# Patient Record
Sex: Male | Born: 1955 | Race: White | Hispanic: No | Marital: Married | Smoking: Never smoker
Health system: Southern US, Community
[De-identification: ages and names within clinical notes are randomized; demographics above are authoritative.]

## PROBLEM LIST (undated history)

## (undated) DIAGNOSIS — J45909 Unspecified asthma, uncomplicated: Secondary | ICD-10-CM

## (undated) DIAGNOSIS — K219 Gastro-esophageal reflux disease without esophagitis: Secondary | ICD-10-CM

## (undated) DIAGNOSIS — J309 Allergic rhinitis, unspecified: Secondary | ICD-10-CM

## (undated) DIAGNOSIS — I10 Essential (primary) hypertension: Secondary | ICD-10-CM

## (undated) HISTORY — DX: Essential (primary) hypertension: I10

## (undated) HISTORY — PX: TONSILLECTOMY: SUR1361

## (undated) HISTORY — DX: Gastro-esophageal reflux disease without esophagitis: K21.9

## (undated) HISTORY — DX: Allergic rhinitis, unspecified: J30.9

## (undated) HISTORY — DX: Unspecified asthma, uncomplicated: J45.909

## (undated) HISTORY — PX: CIRCUMCISION: SUR203

---

## 1999-12-02 ENCOUNTER — Encounter: Admission: RE | Admit: 1999-12-02 | Discharge: 1999-12-02 | Payer: Self-pay | Admitting: Family Medicine

## 1999-12-02 ENCOUNTER — Encounter: Payer: Self-pay | Admitting: Family Medicine

## 2000-09-21 ENCOUNTER — Encounter: Admission: RE | Admit: 2000-09-21 | Discharge: 2000-09-21 | Payer: Self-pay | Admitting: *Deleted

## 2000-09-21 ENCOUNTER — Encounter: Payer: Self-pay | Admitting: *Deleted

## 2004-03-19 ENCOUNTER — Ambulatory Visit: Payer: Self-pay | Admitting: Internal Medicine

## 2004-03-28 ENCOUNTER — Ambulatory Visit: Payer: Self-pay | Admitting: Internal Medicine

## 2004-04-03 ENCOUNTER — Ambulatory Visit: Payer: Self-pay | Admitting: Internal Medicine

## 2004-04-11 ENCOUNTER — Ambulatory Visit: Payer: Self-pay | Admitting: Internal Medicine

## 2004-04-25 ENCOUNTER — Ambulatory Visit: Payer: Self-pay | Admitting: Internal Medicine

## 2004-05-01 ENCOUNTER — Ambulatory Visit: Payer: Self-pay | Admitting: Internal Medicine

## 2004-05-09 ENCOUNTER — Ambulatory Visit: Payer: Self-pay | Admitting: Internal Medicine

## 2004-05-15 ENCOUNTER — Ambulatory Visit: Payer: Self-pay | Admitting: Internal Medicine

## 2004-05-29 ENCOUNTER — Ambulatory Visit: Payer: Self-pay | Admitting: Internal Medicine

## 2004-07-30 ENCOUNTER — Ambulatory Visit: Payer: Self-pay | Admitting: Internal Medicine

## 2004-08-05 ENCOUNTER — Ambulatory Visit: Payer: Self-pay | Admitting: Internal Medicine

## 2004-10-24 ENCOUNTER — Ambulatory Visit: Payer: Self-pay | Admitting: Internal Medicine

## 2004-12-23 ENCOUNTER — Ambulatory Visit: Payer: Self-pay | Admitting: Internal Medicine

## 2005-05-01 ENCOUNTER — Ambulatory Visit: Payer: Self-pay | Admitting: Internal Medicine

## 2005-06-25 ENCOUNTER — Ambulatory Visit: Payer: Self-pay | Admitting: Internal Medicine

## 2009-09-06 ENCOUNTER — Ambulatory Visit: Payer: Self-pay | Admitting: Internal Medicine

## 2009-09-06 DIAGNOSIS — I10 Essential (primary) hypertension: Secondary | ICD-10-CM | POA: Insufficient documentation

## 2009-09-06 DIAGNOSIS — R0602 Shortness of breath: Secondary | ICD-10-CM | POA: Insufficient documentation

## 2009-09-06 DIAGNOSIS — K219 Gastro-esophageal reflux disease without esophagitis: Secondary | ICD-10-CM | POA: Insufficient documentation

## 2009-09-06 DIAGNOSIS — J3089 Other allergic rhinitis: Secondary | ICD-10-CM

## 2009-09-06 DIAGNOSIS — J302 Other seasonal allergic rhinitis: Secondary | ICD-10-CM | POA: Insufficient documentation

## 2009-10-08 ENCOUNTER — Ambulatory Visit: Payer: Self-pay | Admitting: Internal Medicine

## 2009-12-09 ENCOUNTER — Ambulatory Visit: Payer: Self-pay | Admitting: Internal Medicine

## 2010-04-08 NOTE — Assessment & Plan Note (Signed)
Summary: ROV 2 MONTHS///KP   Primary Provider/Referring Provider:  C. Allan Ross/ [place]  CC:  2 month follow up visit-SOB and GERD; has changed diet and exercise-doing better.Marland Kitchen  History of Present Illness: History of Present Illness: September 06, 2009- 55 yo M previously seen in 2007 for allergic rhinitis and asthmatic bronchitis on allergy vaccine. Never smked. He had been doing much better in recent years. GERD was a recognized part of his bronchits and was being treated otc. In the last 3-4 weeks, for unclear reason, his GERD symptoms and cough have been worse. Chest tight without chest pain. Some cough, no wheeze. He restarted Prevacid this week and thinks it may be helping. Always some nasal/ sinus congestion helped by claritin D. Chest worse in humid weather. Denies exertional dyspnea and breathing doesn't wake him.  Some nasal stuffiness. Uses repirator mask to work outdoors. Did use albuterol and Advair in past and was on allergy vaccine.  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 Used Prevacid for GERD. When he tried off it he noted reflux again. Dr Tenny Craw put him on omeprazole 20 mg. Denies wheeze or cough. He hadn't noted relationship to the current air quality- yellow/ orange alerts. PFT- mild, small airway obstruction with reponse to bronchodilator. Qvar 80 sample is being used regularly. He isn't clear yet if it made a difference.  December 09, 2009- Allergic rhinitis, asthmatic brpnchitis, Dyspnea, GERD 2 month follow up visit-SOB and GERD; has changed diet and exercise-doing better. He is paying more attention to diet and reflux. Denies waking choking. He is aware of reflux but not heart burn. Discussed esophageal vs airway symptoms. Will get flu vax at work. He wishes to keep coming here for annual f/u for awhile.   Preventive Screening-Counseling & Management  Alcohol-Tobacco  Smoking Status: never  Current Medications (verified): 1)  Micardis 40 Mg Tabs (Telmisartan) .... Take 1 By Mouth Once Daily 2)  Klonopin 1 Mg Tabs (Clonazepam) .... Take 1 By Mouth At Bedtime 3)  Claritin-D 24 Hour 10-240 Mg Xr24h-Tab (Loratadine-Pseudoephedrine) .... Take 1 By Mouth Once Daily As Needed  Allergies (verified): 1)  ! Doxycycline 2)  ! Sulfa  Past History:  Past Medical History: Last updated: 09/06/2009 Allergic Rhinitis Asthmatic bronchitis G E R D Hypertension  Past Surgical History: Last updated: 09/06/2009 circumcision Tonsillectomy  Family History: Last updated: 09/06/2009 family hx of alleriges: hay fever Mother- died Pulmonary embolism Father- HTN  Social History: Last updated: 09/06/2009 Married with step children Purchasing non smoker occasional ETOH  Risk Factors: Smoking Status: never (12/09/2009)  Review of Systems      See HPI  The patient denies shortness of breath with activity, shortness of breath at rest, productive cough, non-productive cough, coughing up blood, chest pain, irregular heartbeats, acid heartburn, indigestion, loss of appetite, weight change, abdominal pain, difficulty swallowing, sore throat, tooth/dental problems, headaches, nasal congestion/difficulty breathing through nose, and sneezing.    Vital Signs:  Patient profile:   55 year old male Height:      71 inches Weight:      189 pounds BMI:     26.46 O2 Sat:      95 % on Room air Pulse rate:   69 / minute BP sitting:   120 / 82  (left arm) Cuff size:   regular  Vitals Entered By: Reynaldo Minium CMA (December 09, 2009 2:34 PM)  O2 Flow:  Room air CC: 2 month follow up visit-SOB and GERD; has changed diet and exercise-doing better.   Physical Exam  Additional Exam:  General: A/Ox3; pleasant and cooperative, NAD, wdwn SKIN: no rash, lesions NODES: no lymphadenopathy HEENT: [place]/AT, EOM- WNL, Conjuctivae- clear, PERRLA, TM-WNL, Nose- clear, Throat- clear  and wnl, Mallampati  III-IV NECK: Supple w/ fair ROM, JVD- none, normal carotid impulses w/o bruits Thyroid- normal to palpation CHEST: Clear to P&A, HEART: RRR, no m/g/r heard ABDOMEN: Soft and nl;  HYQ:MVHQ, nl pulses, no edema , no cyanosis or clubbing NEURO: Grossly intact to observation      Impression & Recommendations:  Problem # 1:  ALLERGIC RHINITIS (ICD-477.9)  Good control currently but he comments on seasonal stuffiness. I discussed the role of decongestants.   Problem # 2:  DYSPNEA (ICD-786.05)  There has been mild small airway spasm "asthma" - very mild, intermittent. He is comfortable now, and better able to differentiate asthma vs GERD. We will leave him with a rescue inhaler sample.  Problem # 3:  G E R D (ICD-530.81)  Ongoing GERD precautions are discussed. The following medications were removed from the medication list:    Omeprazole 20 Mg Cpdr (Omeprazole) .Marland Kitchen... Take 1 by mouth once daily  Other Orders: Est. Patient Level IV (46962)  Patient Instructions: 1)  cc Dr Tenny Craw 2)  Please schedule a follow-up appointment in 1 year. 3)  Pay attention to the reflux and discuss with Dr Tenny Craw. 4)  Sample rescue inhaler: 2 pufrfs, four times a day if needed for chest tightness, wheeze, asthma.   Immunization History:  Influenza Immunization History:    Influenza:  historical (12/17/2008)

## 2010-04-08 NOTE — Miscellaneous (Signed)
Summary: Orders Update pft charges  Clinical Lists Changes  Orders: Added new Service order of Carbon Monoxide diffusing w/capacity (94720) - Signed Added new Service order of Lung Volumes (94240) - Signed Added new Service order of Spirometry (Pre & Post) (94060) - Signed 

## 2010-04-08 NOTE — Assessment & Plan Note (Signed)
Summary: ROV AFTER PFT ///KP   Primary Provider/Referring Provider:  C. Allan Ross/ [place]  CC:  Follow up visit after PFT.Marland Kitchen  History of Present Illness: History of Present Illness: September 06, 2009- 55 yo M previously seen in 2007 for allergic rhinitis and asthmatic bronchitis on allergy vaccine. Never smked. He had been doing much better in recent years. GERD was a recognized part of his bronchits and was being treated otc. In the last 3-4 weeks, for unclear reason, his GERD symptoms and cough have been worse. Chest tight without chest pain. Some cough, no wheeze. He restarted Prevacid this week and thinks it may be helping. Always some nasal/ sinus congestion helped by claritin D. Chest worse in humid weather. Denies exertional dyspnea and breathing doesn't wake him.  Some nasal stuffiness. Uses repirator mask to work outdoors. Did use albuterol and Advair in past and was on allergy vaccine.  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 Used Prevacid for GERD. When he tried off it he noted reflux again. Dr Tenny Craw put him on omeprazole 20 mg. Denies wheeze or cough. He hadn't noted relationship to the current air quality- yellow/ orange alerts. PFT- mild, small airway obstruction with reponse to bronchodilator. Qvar 80 sample is being used regularly. He isn't clear yet if it made a difference.   Preventive Screening-Counseling & Management  Alcohol-Tobacco     Smoking Status: never  Current Medications (verified): 1)  Omeprazole 20 Mg Cpdr (Omeprazole) .... Take 1 By Mouth Once Daily 2)  Micardis 40 Mg Tabs (Telmisartan) .... Take 1 By Mouth Once Daily 3)  Klonopin 1 Mg Tabs (Clonazepam) .... Take 1 By Mouth At Bedtime 4)  Claritin-D 24 Hour 10-240 Mg Xr24h-Tab (Loratadine-Pseudoephedrine) .... Take 1 By Mouth Once Daily As Needed  Allergies (verified): 1)  ! Doxycycline 2)  ! Sulfa  Past  History:  Past Medical History: Last updated: 09/06/2009 Allergic Rhinitis Asthmatic bronchitis G E R D Hypertension  Past Surgical History: Last updated: 09/06/2009 circumcision Tonsillectomy  Family History: Last updated: 09/06/2009 family hx of alleriges: hay fever Mother- died Pulmonary embolism Father- HTN  Social History: Last updated: 09/06/2009 Married with step children Purchasing non smoker occasional ETOH  Risk Factors: Smoking Status: never (10/08/2009)  Review of Systems      See HPI       The patient complains of shortness of breath with activity and acid heartburn.  The patient denies shortness of breath at rest, productive cough, non-productive cough, coughing up blood, chest pain, irregular heartbeats, indigestion, loss of appetite, weight change, abdominal pain, difficulty swallowing, sore throat, tooth/dental problems, headaches, nasal congestion/difficulty breathing through nose, and sneezing.    Vital Signs:  Patient profile:   55 year old male Height:      71 inches Weight:      189 pounds BMI:     26.46 O2 Sat:      95 % on Room air Pulse rate:   81 / minute BP sitting:   118 / 76  (left arm) Cuff size:   regular  Vitals Entered By: Reynaldo Minium CMA (October 08, 2009 1:56 PM)  O2 Flow:  Room air CC: Follow up visit after PFT.   Physical Exam  Additional Exam:  General: A/Ox3; pleasant and cooperative, NAD, wdwn SKIN: no rash, lesions NODES: no lymphadenopathy HEENT: [place]/AT, EOM- WNL, Conjuctivae- clear, PERRLA, TM-WNL, Nose- clear, Throat- clear and wnl,  Mallampati  III-IV NECK: Supple w/ fair ROM, JVD- none, normal carotid impulses w/o bruits Thyroid- normal to palpation CHEST: Clear to P&A, no cough or wheeze after albuterol today for PFT HEART: RRR, no m/g/r heard ABDOMEN: Soft and nl;  EAV:WUJW, nl pulses, no edema , no cyanosis or clubbing NEURO: Grossly intact to observation      CXR  Procedure date:   09/06/2009  Findings:      DG CHEST 2 VIEW - [id number]   Clinical Data: History of dyspnea.  Shortness of breath.   CHEST - 2 VIEW   Comparison: 05/30/2002 study   Findings: The cardiac silhouette is normal size and shape. Small granuloma calcifications are stable.  No active granulomatous process is seen.  No active pulmonary infiltrates are identified. Bones appear average for age.   IMPRESSION: No acute or active cardiopulmonary process is seen.  Chronic changes are stable and are detailed above.   Read By:  Crawford Givens,  M.D.     Released By:  Call, Onalee Hua,  M.D.  _________  Impression & Recommendations:  Problem # 1:  DYSPNEA (ICD-786.05)  Reversible small airway reactivity c/w mild asthma. I will have him finish Qvar sample, combined with Singulair sample. When he runs out he can quit to observe, with refillable scripts if he decides they have been helping. We can add a rescue inhaler etc as needed.  Problem # 2:  G E R D (ICD-530.81)  He is now working with Dr Tenny Craw to control this. He has a better insight into how this makes his chest feel.  His updated medication list for this problem includes:    Omeprazole 20 Mg Cpdr (Omeprazole) .Marland Kitchen... Take 1 by mouth once daily  Medications Added to Medication List This Visit: 1)  Omeprazole 20 Mg Cpdr (Omeprazole) .... Take 1 by mouth once daily 2)  Qvar 80 Mcg/act Aers (Beclomethasone dipropionate) .... 2 puffs and rinse mouth twice daily 3)  Singulair 10 Mg Tabs (Montelukast sodium) .Marland Kitchen.. 1 daily  Other Orders: Est. Patient Level IV (95621)  Patient Instructions: 1)  Please schedule a follow-up appointment in 2 months. Call sooner if needed. 2)  scripts for Qvar 80- 3)  Sample/ script for Singulair 10 mg- 1 daily 4)  if these make a difference after you finish current supplies, then you can refill and stay on them. Prescriptions: SINGULAIR 10 MG TABS (MONTELUKAST SODIUM) 1 daily  #30 x prn   Entered and Authorized by:    Waymon Budge MD   Signed by:   Waymon Budge MD on 10/08/2009   Method used:   Print then Give to Patient   RxID:   [id number] QVAR 80 MCG/ACT AERS (BECLOMETHASONE DIPROPIONATE) 2 puffs and rinse mouth twice daily  #1 x prn   Entered and Authorized by:   Waymon Budge MD   Signed by:   Waymon Budge MD on 10/08/2009   Method used:   Print then Give to Patient   RxID:   [id number]      CXR  Procedure date:  09/06/2009  Findings:      DG CHEST 2 VIEW - [id number]   Clinical Data: History of dyspnea.  Shortness of breath.   CHEST - 2 VIEW   Comparison: 05/30/2002 study   Findings: The cardiac silhouette is normal size and shape. Small granuloma calcifications are stable.  No active granulomatous process is seen.  No active pulmonary infiltrates are identified. Bones appear average for age.  IMPRESSION: No acute or active cardiopulmonary process is seen.  Chronic changes are stable and are detailed above.   Read By:  Crawford Givens,  M.D.     Released By:  Crawford Givens,  M.D.  _________

## 2010-04-08 NOTE — Assessment & Plan Note (Signed)
Summary: breathing problem/ mbw   Primary Provider/Referring Provider:  C. Allan Ross/ [place]  CC:  Pulmonary new pt-seen by CDY in 2007; cough..  History of Present Illness: September 06, 2009- 55 yo M previously seen in 2007 for allergic rhinitis and asthmatic bronchitis on allergy vaccine. Never smked. He had been doing much better in recent years. GERD was a recognized part of his bronchits and was being treated otc. In the last 3-4 weeks, for unclear reason, his GERD symptoms and cough have been worse. Chest tight without chest pain. Some cough, no wheeze. He restarted Prevacid this week and thinks it may be helping. Always some nasal/ sinus congestion helped by claritin D. Chest worse in humid weather. Denies exertional dyspnea and breathing doesn't wake him.  Some nasal stuffiness. Uses repirator mask to work outdoors. Did use albuterol and Advair in past and was on allergy vaccine.  Preventive Screening-Counseling & Management  Alcohol-Tobacco     Smoking Status: never  Current Medications (verified): 1)  Prevacid 15 Mg Cpdr (Lansoprazole) .... Take 1 By Mouth Two Times A Day 2)  Micardis 40 Mg Tabs (Telmisartan) .... Take 1 By Mouth Once Daily 3)  Klonopin 1 Mg Tabs (Clonazepam) .... Take 1 By Mouth At Bedtime 4)  Claritin-D 24 Hour 10-240 Mg Xr24h-Tab (Loratadine-Pseudoephedrine) .... Take 1 By Mouth Once Daily As Needed  Allergies (verified): 1)  ! Doxycycline 2)  ! Sulfa  Past History:  Family History: Last updated: 09/06/2009 family hx of alleriges: hay fever Mother- died Pulmonary embolism Father- HTN  Social History: Last updated: 09/06/2009 Married with step children Purchasing non smoker occasional ETOH  Risk Factors: Smoking Status: never (09/06/2009)  Past Medical History: Allergic Rhinitis Asthmatic bronchitis G E R D Hypertension  Past Surgical History: circumcision Tonsillectomy  Family History: family hx of alleriges: hay fever Mother- died  Pulmonary embolism Father- HTN  Social History: Married with step children Purchasing non smoker occasional ETOHSmoking Status:  never  Review of Systems      See HPI       The patient complains of shortness of breath at rest, non-productive cough, and acid heartburn.  The patient denies shortness of breath with activity, productive cough, coughing up blood, chest pain, irregular heartbeats, indigestion, loss of appetite, weight change, abdominal pain, difficulty swallowing, sore throat, tooth/dental problems, headaches, nasal congestion/difficulty breathing through nose, sneezing, itching, ear ache, anxiety, depression, hand/feet swelling, joint stiffness or pain, rash, change in color of mucus, and fever.    Vital Signs:  Patient profile:   55 year old male Weight:      183 pounds O2 Sat:      94 % on Room air Pulse rate:   78 / minute BP sitting:   118 / 76  (left arm) Cuff size:   regular  Vitals Entered By: Reynaldo Minium CMA (September 06, 2009 1:37 PM)  O2 Flow:  Room air CC: Pulmonary new pt-seen by CDY in 2007; cough.   Physical Exam  Additional Exam:  General: A/Ox3; pleasant and cooperative, NAD, wdwn SKIN: no rash, lesions NODES: no lymphadenopathy HEENT: [place]/AT, EOM- WNL, Conjuctivae- clear, PERRLA, TM-WNL, Nose- clear, Throat- clear and wnl, Mallampati  III NECK: Supple w/ fair ROM, JVD- none, normal carotid impulses w/o bruits Thyroid- normal to palpation CHEST: Clear to P&A HEART: RRR, no m/g/r heard ABDOMEN: Soft and nl; nml bowel sounds; no organomegaly or masses noted VQQ:VZDG, nl pulses, no edema , no cyanosis or clubbing NEURO: Grossly intact  to observation      Impression & Recommendations:  Problem # 1:  DYSPNEA (ICD-786.05)   He realizes GERD might cause these feelings. Air quality/ pollen allergy and tracheobronchitis/ asthma may be important. We will give steroid inhaler sample, CXR, PFT.  Problem # 2:  G E R D (ICD-530.81)  Continue regular  use of acid blocker for at least another week to assess effect. His updated medication list for this problem includes:    Prevacid 15 Mg Cpdr (Lansoprazole) .Marland Kitchen... Take 1 by mouth two times a day  Problem # 3:  ALLERGIC RHINITIS (ICD-477.9) Perrenial stuffiness but not a significant exam today. Encouraged ongoing use of his mask when outdoors.  Medications Added to Medication List This Visit: 1)  Prevacid 15 Mg Cpdr (Lansoprazole) .... Take 1 by mouth two times a day 2)  Micardis 40 Mg Tabs (Telmisartan) .... Take 1 by mouth once daily 3)  Klonopin 1 Mg Tabs (Clonazepam) .... Take 1 by mouth at bedtime 4)  Claritin-d 24 Hour 10-240 Mg Xr24h-tab (Loratadine-pseudoephedrine) .... Take 1 by mouth once daily as needed  Other Orders: Est. Patient Level IV (99214) T-2 View CXR (71020TC)  Patient Instructions: 1)  Please schedule a follow-up appointment in 1 month. 2)  Use up sample Qvar 80, 2 puffs and rinse mouth, once every day 3)  Continue acid blocker for at least another week before deciding whether it makes a difference. 4)  A chest x-ray has been recommended.  Your imaging study may require preauthorization.  5)  Schedule PFT

## 2010-12-03 ENCOUNTER — Encounter: Payer: Self-pay | Admitting: Internal Medicine

## 2010-12-08 ENCOUNTER — Encounter: Payer: Self-pay | Admitting: Internal Medicine

## 2010-12-08 ENCOUNTER — Ambulatory Visit (INDEPENDENT_AMBULATORY_CARE_PROVIDER_SITE_OTHER): Payer: 59 | Admitting: Internal Medicine

## 2010-12-08 VITALS — BP 114/82 | HR 73 | Ht 71.0 in | Wt 192.2 lb

## 2010-12-08 DIAGNOSIS — J309 Allergic rhinitis, unspecified: Secondary | ICD-10-CM

## 2010-12-08 NOTE — Assessment & Plan Note (Signed)
Not quite good enough this year.  We will suggest an otc eyedrop to start, and a change from loratadine to fexofenadine for trial. Explained decongestants. He will get flu vax at work.

## 2010-12-08 NOTE — Progress Notes (Signed)
HPI- 12/08/10- 55 year old male never smoker followed for allergic rhinitis, asthmatic bronchitis, dyspnea, complicated by GERD. Last here-12/09/2009. Since last visit he is still occasionally aware of reflux and takes Prevacid. Now with a fall season he has watery eyes and nose. This year seems worse than last year. Daily loratadine is not helping very much and he plans to try Allegra. We discussed over-the-counter eyedrops for allergy. We also discussed use of decongestants. He denies chest tightness wheeze. We had given a sample of Xopenex which she has only used twice in the past year with good results. He is going to keep that all Xopenex HFA sample inhaler for a while he'll get a flu vaccine at work.  ROS- Constitutional:   No-   weight loss, night sweats, fevers, chills, fatigue, lassitude. HEENT:   No-  headaches, difficulty swallowing, tooth/dental problems, sore throat,       No-  sneezing, itching, ear ache, nasal congestion, post nasal drip,  CV:  No-   chest pain, orthopnea, PND, swelling in lower extremities, anasarca, dizziness, palpitations Resp: No-   shortness of breath with exertion or at rest.              No-   productive cough,  No non-productive cough,  No-  coughing up of blood.              No-   change in color of mucus.  Very slight rare wheezing.   Skin: No-   rash or lesions. GI:  + heartburn, indigestion, no- abdominal pain, nausea, vomiting, diarrhea,                 change in bowel habits, loss of appetite GU: No-   dysuria, change in color of urine, no urgency or frequency.  No- flank pain. MS:  No-   joint pain or swelling.  No- decreased range of motion.  No- back pain. Neuro- grossly normal to observation, Or:  Psych:  No- change in mood or affect. No depression or anxiety.  No memory loss.  OBJ General- Alert, Oriented, Affect-appropriate, Distress- none acute Skin- rash-none, lesions- none, excoriation- none Lymphadenopathy- none Head- atraumatic     Eyes- Gross vision intact, PERRLA, conjunctivae clear secretions            Ears- Hearing, canals-normal            Nose- Clear, no-Septal dev, mucus, polyps, erosion, perforation             Throat- Mallampati II , mucosa clear , drainage- none, tonsils- atrophic Neck- flexible , trachea midline, no stridor , thyroid nl, carotid no bruit Chest - symmetrical excursion , unlabored           Heart/CV- RRR , no murmur , no gallop  , no rub, nl s1 s2                           - JVD- none , edema- none, stasis changes- none, varices- none           Lung- clear to P&A, wheeze- none, cough- none , dullness-none, rub- none           Chest wall-  Abd- tender-no, distended-no, bowel sounds-present, HSM- no Br/ Gen/ Rectal- Not done, not indicated Extrem- cyanosis- none, clubbing, none, atrophy- none, strength- nl Neuro- grossly intact to observation

## 2010-12-08 NOTE — Patient Instructions (Signed)
Consider trying otc Allegra/ fexofenadine as an alternative to your loratadine as an antihistamine. You can add the decongestant when needed.  Please call as needed

## 2010-12-19 ENCOUNTER — Ambulatory Visit: Payer: Self-pay | Admitting: Internal Medicine

## 2011-05-19 ENCOUNTER — Emergency Department (HOSPITAL_COMMUNITY): Payer: BC Managed Care – PPO

## 2011-05-19 ENCOUNTER — Other Ambulatory Visit: Payer: Self-pay

## 2011-05-19 ENCOUNTER — Emergency Department (HOSPITAL_COMMUNITY)
Admission: EM | Admit: 2011-05-19 | Discharge: 2011-05-19 | Disposition: A | Discharge status: Home / Self Care | Payer: BC Managed Care – PPO | Attending: Emergency Medicine | Admitting: Emergency Medicine

## 2011-05-19 ENCOUNTER — Encounter (HOSPITAL_COMMUNITY): Payer: Self-pay | Admitting: *Deleted

## 2011-05-19 DIAGNOSIS — R0789 Other chest pain: Secondary | ICD-10-CM

## 2011-05-19 DIAGNOSIS — I10 Essential (primary) hypertension: Secondary | ICD-10-CM | POA: Insufficient documentation

## 2011-05-19 DIAGNOSIS — K219 Gastro-esophageal reflux disease without esophagitis: Secondary | ICD-10-CM

## 2011-05-19 DIAGNOSIS — R209 Unspecified disturbances of skin sensation: Secondary | ICD-10-CM | POA: Insufficient documentation

## 2011-05-19 DIAGNOSIS — R12 Heartburn: Secondary | ICD-10-CM | POA: Insufficient documentation

## 2011-05-19 LAB — CBC
Platelets: 186 10*3/uL (ref 150–400)
RBC: 4.82 MIL/uL (ref 4.22–5.81)
WBC: 5 10*3/uL (ref 4.0–10.5)

## 2011-05-19 LAB — URINALYSIS, ROUTINE W REFLEX MICROSCOPIC
Ketones, ur: NEGATIVE mg/dL
Leukocytes, UA: NEGATIVE
Nitrite: NEGATIVE
Specific Gravity, Urine: 1.01 (ref 1.005–1.030)
Urobilinogen, UA: 0.2 mg/dL (ref 0.0–1.0)
pH: 5.5 (ref 5.0–8.0)

## 2011-05-19 LAB — BASIC METABOLIC PANEL
CO2: 27 mEq/L (ref 19–32)
Calcium: 9.4 mg/dL (ref 8.4–10.5)
Chloride: 101 mEq/L (ref 96–112)
Sodium: 138 mEq/L (ref 135–145)

## 2011-05-19 LAB — TROPONIN I: Troponin I: <0.3 ng/mL (ref <0.30)

## 2011-05-19 MED ORDER — OMEPRAZOLE 20 MG PO CPDR: 20.0000 mg | DELAYED_RELEASE_CAPSULE | Freq: Every day | ORAL | Status: DC

## 2011-05-19 MED ORDER — ASPIRIN 81 MG PO CHEW
CHEWABLE_TABLET | ORAL | Status: AC
  Administered 2011-05-19: 324 mg
  Filled 2011-05-19: qty 4

## 2011-05-19 MED ORDER — ASPIRIN 325 MG PO TABS: 325.0000 mg | ORAL_TABLET | ORAL | Status: DC

## 2011-05-19 NOTE — Discharge Instructions (Signed)
[facility].Chest Pain (Nonspecific) Chest pain has many causes. Your pain could be caused by something serious, such as a heart attack or a blood clot in the lungs. It could also be caused by something less serious, such as a chest bruise or a virus. Follow up with your doctor. More lab tests or other studies may be needed to find the cause of your pain. Most of the time, nonspecific chest pain will improve within 2 to 3 days of rest and mild pain medicine. HOME CARE  For chest bruises, you may put ice on the sore area for 15 to 20 minutes, 3 to 4 times a day. Do this only if it makes you or your child feel better.   Put ice in a plastic bag.   Place a towel between the skin and the bag.   Rest for the next 2 to 3 days.   Go back to work if the pain improves.   See your doctor if the pain lasts longer than 1 to 2 weeks.   Only take medicine as told by your doctor.   Quit smoking if you smoke.  GET HELP RIGHT AWAY IF:   There is more pain or pain that spreads to the arm, neck, jaw, back, or belly (abdomen).   You or your child has shortness of breath.   You or your child coughs more than usual or coughs up blood.   You or your child has very bad back or belly pain, feels sick to his or her stomach (nauseous), or throws up (vomits).   You or your child has very bad weakness.   You or your child passes out (faints).   You or your child has a temperature by mouth above 102 F (38.9 C), not controlled by medicine.  Any of these problems may be serious and may be an emergency. Do not wait to see if the problems will go away. Get medical help right away. Call your local emergency services 911 in [place] Do not drive yourself to the hospital. MAKE SURE YOU:   Understand these instructions.   Will watch this condition.   Will get help right away if you or your child is not doing well or gets worse.  Document Released: 08/12/2007 Document Revised: 02/12/2011 Document Reviewed:  08/12/2007 [facility] Patient Information 2012 [place], [place].

## 2011-05-19 NOTE — ED Provider Notes (Signed)
History     CSN: [id number]  Arrival date & time 05/19/11  1502   First MD Initiated Contact with Patient 05/19/11 1649      Chief Complaint  Patient presents with  . Chest Pain  . Numbness    ) HPI  Patient with reported onset of chest pressure and numbness in his face and arm on the left.  Patient reports this started 2-3 days ago.  And mild.  Does not get worse when he goes swimming.  Seems to be very focal in his left upper chest.  He also complains of heartburn and reflux symptoms.  Today he had a little but of numbness to his left hand little bit of numbness in the jaw.  Denies diaphoresis, shortness of breath, nausea vomiting.  Patient has only one risk factor of hypertension and is negative for hyperlipidemia, smoking, family history.  Past Medical History  Diagnosis Date  . Allergic rhinitis, cause unspecified   . Esophageal reflux   . Unspecified essential hypertension   . Asthmatic bronchitis     Past Surgical History  Procedure Date  . Tonsillectomy   . Circumcision     Family History  Problem Relation Age of Onset  . Hypertension Father     History  Substance Use Topics  . Smoking status: Never Smoker   . Smokeless tobacco: Never Used  . Alcohol Use: Yes     Socially      Review of Systems  Allergies  Doxycycline and Sulfonamide derivatives  Home Medications   Current Outpatient Rx  Name Route Sig Dispense Refill  . HYDROXYZINE HCL 25 MG PO TABS Oral Take 25 mg by mouth at bedtime as needed. For sleep    . LANSOPRAZOLE 15 MG PO CPDR Oral Take 15 mg by mouth daily as needed. For acid reflux    . LEVALBUTEROL TARTRATE 45 MCG/ACT IN AERO Inhalation Inhale 1-2 puffs into the lungs every 4 (four) hours as needed.      Marland Kitchen LORATADINE 10 MG PO TABS Oral Take 10 mg by mouth daily.      Marland Kitchen PSEUDOEPHEDRINE HCL 30 MG PO TABS Oral Take 30 mg by mouth as needed. For sinus congestion    . TELMISARTAN 40 MG PO TABS Oral Take 40 mg by mouth daily.      Marland Kitchen  OMEPRAZOLE 20 MG PO CPDR Oral Take 1 capsule (20 mg total) by mouth daily. 30 capsule 0    BP 126/87  Pulse 64  Temp(Src) 98.2 F (36.8 C) (Oral)  Resp 20  Ht 5\' 11"  (1.803 m)  Wt 191 lb (86.637 kg)  BMI 26.64 kg/m2  SpO2 94%  Physical Exam  Nursing note and vitals reviewed. Constitutional: He is oriented to person, place, and time. He appears well-developed and well-nourished. No distress.  HENT:  Head: Normocephalic and atraumatic.  Eyes: Pupils are equal, round, and reactive to light.  Neck: Normal range of motion.  Cardiovascular: Normal rate and intact distal pulses.         Date: 05/19/2011  Rate: 73  Rhythm: normal sinus rhythm  QRS Axis: normal  Intervals: normal  ST/T Wave abnormalities: normal  Conduction Disutrbances: none  Narrative Interpretation: unremarkable      Pulmonary/Chest: No respiratory distress.  Abdominal: Normal appearance. He exhibits no distension.  Musculoskeletal: Normal range of motion.  Neurological: He is alert and oriented to person, place, and time. No cranial nerve deficit.  Skin: Skin is warm and dry. No rash noted.  Psychiatric: He has a normal mood and affect. His behavior is normal.    ED Course  Procedures (including critical care time)  Labs Reviewed  CBC - Abnormal; Notable for the following:    MCHC 36.1 (*)    All other components within normal limits  BASIC METABOLIC PANEL  TROPONIN I  URINALYSIS, ROUTINE W REFLEX MICROSCOPIC   Dg Chest 2 View  05/19/2011  *RADIOLOGY REPORT*  Clinical Data: Chest pain  CHEST - 2 VIEW  Comparison: Chest x-ray of 09/06/2009  Findings: The lungs are clear.  Mediastinal contours appear normal. The heart is within normal limits in size.  No bony abnormality is seen.  IMPRESSION: Stable chest x-ray.  No active lung disease.  Original Report Authenticated By: Juline Patch, M.D.     1. GERD (gastroesophageal reflux disease)   2. Chest discomfort       MDM          Nelia Shi, MD 05/19/11 1948

## 2011-05-19 NOTE — ED Notes (Signed)
Patient with reported onset of chest pressure and numbness in his face and arm on the left

## 2011-05-19 NOTE — ED Notes (Signed)
NAD distress at dc resp easy on labored skin warm and dry

## 2011-05-19 NOTE — ED Notes (Signed)
First contact with pt. Pt alert x3 resp easy non labored. Monitors intact wife at bedside. Pt rates cp 3/10 l chest denies radiation, sob, at this time.

## 2011-12-08 ENCOUNTER — Ambulatory Visit (INDEPENDENT_AMBULATORY_CARE_PROVIDER_SITE_OTHER): Payer: BC Managed Care – PPO | Admitting: Internal Medicine

## 2011-12-08 ENCOUNTER — Other Ambulatory Visit: Payer: BC Managed Care – PPO

## 2011-12-08 ENCOUNTER — Encounter: Payer: Self-pay | Admitting: Internal Medicine

## 2011-12-08 VITALS — BP 120/88 | HR 97 | Ht 71.0 in | Wt 188.2 lb

## 2011-12-08 DIAGNOSIS — J309 Allergic rhinitis, unspecified: Secondary | ICD-10-CM

## 2011-12-08 MED ORDER — PHENYLEPHRINE HCL 1 % NA SOLN
3.0000 [drp] | Freq: Once | NASAL | Status: AC
  Administered 2011-12-08: 3 [drp] via NASAL

## 2011-12-08 NOTE — Progress Notes (Signed)
HPI- 12/08/10- 56 year old male never smoker followed for allergic rhinitis, asthmatic bronchitis, dyspnea, complicated by GERD. Last here-12/09/2009. Since last visit he is still occasionally aware of reflux and takes Prevacid. Now with a fall season he has watery eyes and nose. This year seems worse than last year. Daily loratadine is not helping very much and he plans to try Allegra. We discussed over-the-counter eyedrops for allergy. We also discussed use of decongestants. He denies chest tightness wheeze. We had given a sample of Xopenex which he has only used twice in the past year with good results. He is going to keep that all Xopenex HFA sample inhaler for a while he'll get a flu vaccine at work.  12/08/11- 56 year old male never smoker followed for allergic rhinitis, asthmatic bronchitis, dyspnea, complicated by GERD. Recently on Cipro 500mg  x 10days for sinusitis; still having sinus pressure  and had headache yesterday; was also given Flonase by Dr Tenny Craw. Yesterday head right retro-orbital headache/frontal. That has resolved. Some postnasal drip leads to cough. Left ear feels full and makes her dizzy. Only occasionally pops.   ROS- Constitutional:   No-   weight loss, night sweats, fevers, chills, fatigue, lassitude. HEENT:   No-  headaches, difficulty swallowing, tooth/dental problems, sore throat,       No-  sneezing, itching, ear ache, nasal congestion, post nasal drip,  CV:  No-   chest pain, orthopnea, PND, swelling in lower extremities, anasarca, dizziness, palpitations Resp: No-   shortness of breath with exertion or at rest.              No-   productive cough,  No non-productive cough,  No-  coughing up of blood.              No-   change in color of mucus.  Very slight rare wheezing.   Skin: No-   rash or lesions. GI:  + heartburn, indigestion, no- abdominal pain, nausea, vomiting, GU:  MS:  No-   joint pain or swelling.  . Neuro- nothing unusual  Psych:  No- change in mood  or affect. No depression or anxiety.  No memory loss.  OBJ General- Alert, Oriented, Affect-appropriate, Distress- none acute Skin- rash-none, lesions- none, excoriation- none Lymphadenopathy- none Head- atraumatic            Eyes- Gross vision intact, PERRLA, conjunctivae clear secretions            Ears- L TM retracted without erythema or fluid.            Nose- Clear, no-Septal dev, mucus, polyps, erosion, perforation             Throat- Mallampati III , mucosa clear , drainage- none, tonsils- atrophic Neck- flexible , trachea midline, no stridor , thyroid nl, carotid no bruit Chest - symmetrical excursion , unlabored           Heart/CV- RRR , no murmur , no gallop  , no rub, nl s1 s2                           - JVD- none , edema- none, stasis changes- none, varices- none           Lung- clear to P&A, wheeze- none, cough- none , dullness-none, rub- none           Chest wall-  Abd-  Br/ Gen/ Rectal- Not done, not indicated Extrem- cyanosis- none, clubbing, none, atrophy- none, strength-  nl Neuro- grossly intact to observation

## 2011-12-08 NOTE — Patient Instructions (Addendum)
Neb neo nasal  Order- Allergy profile     Allergic rhinitis  Suggest using your Neti pot

## 2011-12-09 LAB — ALLERGY FULL PROFILE
Alternaria Alternata: <0.1 kU/L
Bahia Grass: 0.66 kU/L — ABNORMAL HIGH
Box Elder IgE: 0.47 kU/L — ABNORMAL HIGH
Cat Dander: <0.1 kU/L
Curvularia lunata: <0.1 kU/L
Elm IgE: 0.64 kU/L — ABNORMAL HIGH
Fescue: 0.56 kU/L — ABNORMAL HIGH
G009 Red Top: 0.55 kU/L — ABNORMAL HIGH
Lamb's Quarters: 0.63 kU/L — ABNORMAL HIGH
Oak: 0.56 kU/L — ABNORMAL HIGH
Sycamore Tree: 0.56 kU/L — ABNORMAL HIGH
Timothy Grass: 0.61 kU/L — ABNORMAL HIGH

## 2011-12-11 ENCOUNTER — Telehealth: Payer: Self-pay | Admitting: Internal Medicine

## 2011-12-11 NOTE — Progress Notes (Signed)
Quick Note:  LMTCB with wife ______ 

## 2011-12-11 NOTE — Telephone Encounter (Signed)
Labs to patient per CY. Patient scheduled for allergy testing January 13, 2012 at 3:30 with CY. Patient aware of protocol per Katie.

## 2011-12-17 NOTE — Assessment & Plan Note (Signed)
Recent exacerbation of rhinitis with possible sinusitis and eustachian dysfunction. Plan-allergy profile, Sudafed, saline lavage, consider limited CT scan of sinuses

## 2011-12-23 NOTE — Progress Notes (Signed)
Quick Note:  Jacob Ward, CMA 12/11/2011 4:47 PM Signed Labs to patient per CY. Patient scheduled for allergy testing January 13, 2012 at 3:30 with CY. Patient aware of protocol per Maryclaire Stoecker.   ______

## 2012-01-13 ENCOUNTER — Ambulatory Visit: Payer: BC Managed Care – PPO | Admitting: Internal Medicine

## 2012-01-13 ENCOUNTER — Ambulatory Visit (INDEPENDENT_AMBULATORY_CARE_PROVIDER_SITE_OTHER): Payer: BC Managed Care – PPO | Admitting: Internal Medicine

## 2012-01-13 ENCOUNTER — Encounter: Payer: Self-pay | Admitting: Internal Medicine

## 2012-01-13 VITALS — BP 108/70 | HR 69 | Ht 71.0 in | Wt 188.4 lb

## 2012-01-13 DIAGNOSIS — J309 Allergic rhinitis, unspecified: Secondary | ICD-10-CM

## 2012-01-13 NOTE — Progress Notes (Signed)
HPI- 12/08/10- 56 year old male never smoker followed for allergic rhinitis, asthmatic bronchitis, dyspnea, complicated by GERD. Last here-12/09/2009. Since last visit he is still occasionally aware of reflux and takes Prevacid. Now with a fall season he has watery eyes and nose. This year seems worse than last year. Daily loratadine is not helping very much and he plans to try Allegra. We discussed over-the-counter eyedrops for allergy. We also discussed use of decongestants. He denies chest tightness wheeze. We had given a sample of Xopenex which he has only used twice in the past year with good results. He is going to keep that all Xopenex HFA sample inhaler for a while he'll get a flu vaccine at work.  12/08/11- 57 year old male never smoker followed for allergic rhinitis, asthmatic bronchitis, dyspnea, complicated by GERD. Recently on Cipro 500mg  x 10days for sinusitis; still having sinus pressure  and had headache yesterday; was also given Flonase by Dr Tenny Craw. Yesterday head right retro-orbital headache/frontal. That has resolved. Some postnasal drip leads to cough. Left ear feels full and makes her dizzy. Only occasionally pops.  01/13/12-56 year old male never smoker followed for allergic rhinitis, asthmatic bronchitis, dyspnea, complicated by GERD. No antihistamines, OTC cough syrups, or OTC sleep aids in past 3 days He blew leaves wearing respirator mask. Some fullness in ears, slightly dizzy. Likes Neti pot and flonase. Decongestant only if needed. Gave own allergy vaccine years ago.  Allergy Profile 12/08/11- Total IgE 83.9, specific elevations for grass , weed and tree pollens Allergy skin testing 01/13/12- significant Pos for grass, weed, tree, dust mite, mold, cat.  ROS- Constitutional:   No-   weight loss, night sweats, fevers, chills, fatigue, lassitude. HEENT:   No-  headaches, difficulty swallowing, tooth/dental problems, sore throat,       No-  sneezing, itching, ear ache, +nasal  congestion, post nasal drip,  CV:  No-   chest pain, orthopnea, PND, swelling in lower extremities, anasarca, dizziness, palpitations Resp: No-   shortness of breath with exertion or at rest.              No-   productive cough,  No non-productive cough,  No-  coughing up of blood.              No-   change in color of mucus.  +Very slight rare wheezing.   Skin: No-   rash or lesions. GI:  + heartburn, indigestion, no- abdominal pain, nausea, vomiting, GU:  MS:  No-   joint pain or swelling.  . Neuro- nothing unusual  Psych:  No- change in mood or affect. No depression or anxiety.  No memory loss.  OBJ General- Alert, Oriented, Affect-appropriate, Distress- none acute Skin- rash-none, lesions- none, excoriation- none Lymphadenopathy- none Head- atraumatic            Eyes- Gross vision intact, PERRLA, conjunctivae clear secretions            Ears- Normal hearing, canals and TMs.            Nose- Clear, no-Septal dev, mucus, polyps, erosion, perforation             Throat- Mallampati III , mucosa clear , drainage- none, tonsils- atrophic Neck- flexible , trachea midline, no stridor , thyroid nl, carotid no bruit Chest - symmetrical excursion , unlabored           Heart/CV- RRR , no murmur , no gallop  , no rub, nl s1 s2                           -  JVD- none , edema- none, stasis changes- none, varices- none           Lung- clear to P&A, wheeze- none, cough- none , dullness-none, rub- none           Chest wall-  Abd-  Br/ Gen/ Rectal- Not done, not indicated Extrem- cyanosis- none, clubbing, none, atrophy- none, strength- nl Neuro- grossly intact to observation

## 2012-01-13 NOTE — Assessment & Plan Note (Signed)
Allergic rhinitis with eustachian dysfunction. We agreed to push the medications for best effect, then come back to the possibility of allergy vaccine later if needed.

## 2012-01-13 NOTE — Patient Instructions (Addendum)
We can start allergy shots if needed, but I would like to do the best we can with medications first.   Ok to continue neti pot, otc antihistamines and decongestants as needed  Instead of flonase for now, try sample of Dymista nasal spray     1-2 puffs each nostril once daily at bedtime

## 2012-05-04 ENCOUNTER — Telehealth: Payer: Self-pay | Admitting: Internal Medicine

## 2012-05-04 MED ORDER — FLUTICASONE PROPIONATE 50 MCG/ACT NA SUSP: 1.0000 | Freq: Every day | NASAL | Status: DC

## 2012-05-04 MED ORDER — AZELASTINE-FLUTICASONE 137-50 MCG/ACT NA SUSP: 1.0000 | Freq: Every day | NASAL | Status: DC

## 2012-05-04 MED ORDER — AZELASTINE HCL 0.1 % NA SOLN: 1.0000 | Freq: Every day | NASAL | Status: DC

## 2012-05-04 NOTE — Telephone Encounter (Signed)
rx sent, pt is aware.Jacob Ward, CMA

## 2012-05-04 NOTE — Telephone Encounter (Signed)
Pt states the dymista is too expensive and is asking for an alternative. I advised the pt that we will likely call in the 2 meds that make -up dymista. The pt states this is fine with him. Please advise. Jacob Ward, CMA Allergies  Allergen Reactions  . Doxycycline Rash  . Sulfonamide Derivatives Rash

## 2012-05-04 NOTE — Telephone Encounter (Signed)
Offer Rx Flonase/ fluticasone # 1   1-2 puffs each nostril once daily    Ref prn  And   Rx Astelin # 1, 1-2 puffs each nostril once daily    Ref prn

## 2012-05-04 NOTE — Telephone Encounter (Signed)
Spoke with pt and notified of recs per CDY He verbalized understanding and states that nothing further needed Rxs were sent to pharm

## 2012-07-08 ENCOUNTER — Telehealth: Payer: Self-pay | Admitting: Internal Medicine

## 2012-07-08 NOTE — Telephone Encounter (Signed)
MADE IN ERROR Jacob Ward  °

## 2012-07-12 ENCOUNTER — Ambulatory Visit: Payer: BC Managed Care – PPO | Admitting: Internal Medicine

## 2012-07-27 ENCOUNTER — Ambulatory Visit (INDEPENDENT_AMBULATORY_CARE_PROVIDER_SITE_OTHER): Payer: BC Managed Care – PPO | Admitting: Internal Medicine

## 2012-07-27 ENCOUNTER — Encounter: Payer: Self-pay | Admitting: Internal Medicine

## 2012-07-27 VITALS — BP 118/70 | HR 83 | Ht 71.0 in | Wt 199.0 lb

## 2012-07-27 DIAGNOSIS — J302 Other seasonal allergic rhinitis: Secondary | ICD-10-CM

## 2012-07-27 DIAGNOSIS — J309 Allergic rhinitis, unspecified: Secondary | ICD-10-CM

## 2012-07-27 DIAGNOSIS — J4521 Mild intermittent asthma with (acute) exacerbation: Secondary | ICD-10-CM

## 2012-07-27 DIAGNOSIS — J3089 Other allergic rhinitis: Secondary | ICD-10-CM

## 2012-07-27 DIAGNOSIS — J329 Chronic sinusitis, unspecified: Secondary | ICD-10-CM

## 2012-07-27 DIAGNOSIS — J45901 Unspecified asthma with (acute) exacerbation: Secondary | ICD-10-CM

## 2012-07-27 NOTE — Progress Notes (Signed)
HPI- 12/08/10- 57 year old male never smoker followed for allergic rhinitis, asthmatic bronchitis, dyspnea, complicated by GERD. Last here-12/09/2009. Since last visit he is still occasionally aware of reflux and takes Prevacid. Now with a fall season he has watery eyes and nose. This year seems worse than last year. Daily loratadine is not helping very much and he plans to try Allegra. We discussed over-the-counter eyedrops for allergy. We also discussed use of decongestants. He denies chest tightness wheeze. We had given a sample of Xopenex which he has only used twice in the past year with good results. He is going to keep that all Xopenex HFA sample inhaler for a while he'll get a flu vaccine at work.  12/08/11- 58 year old male never smoker followed for allergic rhinitis, asthmatic bronchitis, dyspnea, complicated by GERD. Recently on Cipro 500mg  x 10days for sinusitis; still having sinus pressure  and had headache yesterday; was also given Flonase by Dr Tenny Craw. Yesterday head right retro-orbital headache/frontal. That has resolved. Some postnasal drip leads to cough. Left ear feels full and makes her dizzy. Only occasionally pops.  01/13/12-57 year old male never smoker followed for allergic rhinitis, asthmatic bronchitis, dyspnea, complicated by GERD. No antihistamines, OTC cough syrups, or OTC sleep aids in past 3 days He blew leaves wearing respirator mask. Some fullness in ears, slightly dizzy. Likes Neti pot and flonase. Decongestant only if needed. Gave own allergy vaccine years ago.  Allergy Profile 12/08/11- Total IgE 83.9, specific elevations for grass , weed and tree pollens Allergy skin testing 01/13/12- significant Pos for grass, weed, tree, dust mite, mold, cat.  07/27/12- 57 year old male never smoker followed for allergic rhinitis, asthmatic bronchitis, dyspnea, complicated by GERD. FOLLOWS FOR: continues to have trouble with ears and alleriges overall. Occasional watery eyes.  Feels  pressure in the ears, frontal headache, mild unsteadiness or dizziness but not real vertigo. Using Aztepro and Flonase nasal sprays, occasional Xopenex rescue inhaler CXR 05/19/11 IMPRESSION:  Stable chest x-ray. No active lung disease.  Original Report Authenticated By: Juline Patch, M.D.   ROS- Constitutional:   No-   weight loss, night sweats, fevers, chills, fatigue, lassitude. HEENT:   +  headaches, difficulty swallowing, tooth/dental problems, sore throat,       No-  sneezing, itching, +ear ache, +nasal congestion, post nasal drip,  CV:  No-   chest pain, orthopnea, PND, swelling in lower extremities, anasarca, dizziness, palpitations Resp: No-   shortness of breath with exertion or at rest.              No-   productive cough,  No non-productive cough,  No-  coughing up of blood.              No-   change in color of mucus.  +Very slight rare wheezing.   Skin: No-   rash or lesions. GI:  + heartburn, indigestion, no- abdominal pain, nausea, vomiting, GU:  MS:  No-   joint pain or swelling.  . Neuro- nothing unusual  Psych:  No- change in mood or affect. No depression or anxiety.  No memory loss.  OBJ General- Alert, Oriented, Affect-appropriate, Distress- none acute Skin- rash-none, lesions- none, excoriation- none Lymphadenopathy- none Head- atraumatic            Eyes- Gross vision intact, PERRLA, conjunctivae clear secretions            Ears- Normal hearing, canals and TMs. No nystagmus            Nose- Clear, no-Septal  dev, mucus, polyps, erosion, perforation             Throat- Mallampati III , mucosa clear , drainage- none, tonsils- atrophic Neck- flexible , trachea midline, no stridor , thyroid nl, carotid no bruit Chest - symmetrical excursion , unlabored           Heart/CV- RRR , no murmur , no gallop  , no rub, nl s1 s2                           - JVD- none , edema- none, stasis changes- none, varices- none           Lung- clear to P&A, wheeze- none, cough- none ,  dullness-none, rub- none           Chest wall-  Abd-  Br/ Gen/ Rectal- Not done, not indicated Extrem- cyanosis- none, clubbing, none, atrophy- none, strength- nl Neuro- grossly intact to observation

## 2012-07-27 NOTE — Patient Instructions (Addendum)
Consider continuing present meds through the first week or so of June, till Spring pollens fade. Then try stopping the nasal sprays for a week or so to see how you feel. After that, if symptomatic, try otc nasal spray Nasalcrom/ cromolyn (may need to ask pharmacist to find it)  Order- limited CT max fac   Dx chronic sinusitis

## 2012-08-02 ENCOUNTER — Ambulatory Visit (INDEPENDENT_AMBULATORY_CARE_PROVIDER_SITE_OTHER)
Admission: RE | Admit: 2012-08-02 | Discharge: 2012-08-02 | Disposition: A | Discharge status: Home / Self Care | Payer: BC Managed Care – PPO | Source: Ambulatory Visit | Attending: Internal Medicine | Admitting: Internal Medicine

## 2012-08-02 ENCOUNTER — Telehealth: Payer: Self-pay | Admitting: Internal Medicine

## 2012-08-02 DIAGNOSIS — J329 Chronic sinusitis, unspecified: Secondary | ICD-10-CM

## 2012-08-02 NOTE — Telephone Encounter (Signed)
Spoke with patient, patient requesting results of CT scan done today. I have advised patient that scan has not had a result note attached yet and that I would let Dr. Maple Hudson be aware that he is wanting results. Dr. Maple Hudson please advise, thank you!  Also once result note has been done patient requesting copy of CT report be mailed to patients home

## 2012-08-03 ENCOUNTER — Telehealth: Payer: Self-pay | Admitting: Internal Medicine

## 2012-08-03 NOTE — Telephone Encounter (Signed)
Result Note    CT maxfac- minimal thickening indicating just a little recent sinus inflammation. This may be more like scarring and doesn't look like significant acute infection.         Pt aware of results.

## 2012-08-03 NOTE — Telephone Encounter (Signed)
Notes Recorded by Waymon Budge, MD on 08/03/2012 at 7:58 AM CT maxfac- minimal thickening indicating just a little recent sinus inflammation. This may be more like scarring and doesn't look like significant acute infection  Spoke with pt and notified of results per Dr. Maple Hudson. Pt verbalized understanding and denied any questions.

## 2012-08-03 NOTE — Progress Notes (Signed)
Quick Note:  Pt aware of results. ______ 

## 2012-08-03 NOTE — Telephone Encounter (Signed)
I did comment on this CT. Ok to report to him and send him a copy of the CT report as requested.

## 2012-08-07 DIAGNOSIS — J4520 Mild intermittent asthma, uncomplicated: Secondary | ICD-10-CM | POA: Insufficient documentation

## 2012-08-07 NOTE — Assessment & Plan Note (Signed)
Very minimal occasional asthma easily controlled with rare use of a rescue inhaler

## 2012-08-07 NOTE — Assessment & Plan Note (Addendum)
Rhinitis exacerbation with eustachian dysfunction Plan-medication options reviewed. Consider Nasalcrom. Limited CT scan to exclude chronic sinusitis

## 2012-09-26 ENCOUNTER — Encounter: Payer: Self-pay | Admitting: Internal Medicine

## 2012-09-26 ENCOUNTER — Ambulatory Visit (INDEPENDENT_AMBULATORY_CARE_PROVIDER_SITE_OTHER): Payer: BC Managed Care – PPO | Admitting: Internal Medicine

## 2012-09-26 VITALS — BP 124/80 | HR 78 | Ht 71.0 in | Wt 200.0 lb

## 2012-09-26 DIAGNOSIS — J329 Chronic sinusitis, unspecified: Secondary | ICD-10-CM

## 2012-09-26 DIAGNOSIS — J302 Other seasonal allergic rhinitis: Secondary | ICD-10-CM

## 2012-09-26 DIAGNOSIS — J45909 Unspecified asthma, uncomplicated: Secondary | ICD-10-CM

## 2012-09-26 DIAGNOSIS — J4521 Mild intermittent asthma with (acute) exacerbation: Secondary | ICD-10-CM

## 2012-09-26 MED ORDER — PHENYLEPHRINE HCL 1 % NA SOLN
3.0000 [drp] | Freq: Once | NASAL | Status: AC
  Administered 2012-09-26: 3 [drp] via NASAL

## 2012-09-26 MED ORDER — METHYLPREDNISOLONE ACETATE 80 MG/ML IJ SUSP
80.0000 mg | Freq: Once | INTRAMUSCULAR | Status: AC
  Administered 2012-09-26: 80 mg via INTRAMUSCULAR

## 2012-10-11 ENCOUNTER — Encounter: Payer: Self-pay | Admitting: Internal Medicine

## 2012-10-11 NOTE — Assessment & Plan Note (Signed)
Mild asthma with bronchitis Plan-resume Advair and Xopenex, using what he has left, to see if it helps the tight chest sensation.

## 2012-10-11 NOTE — Assessment & Plan Note (Signed)
More rhinitis and sinusitis. Plan- neb nasal decongestant, depo

## 2012-10-11 NOTE — Patient Instructions (Signed)
Try again with the Advair and Xopenex you have left, to see if it helps the chest tightness

## 2012-10-11 NOTE — Progress Notes (Signed)
HPI- 12/08/10- 57 year old male never smoker followed for allergic rhinitis, asthmatic bronchitis, dyspnea, complicated by GERD. Last here-12/09/2009. Since last visit he is still occasionally aware of reflux and takes Prevacid. Now with a fall season he has watery eyes and nose. This year seems worse than last year. Daily loratadine is not helping very much and he plans to try Allegra. We discussed over-the-counter eyedrops for allergy. We also discussed use of decongestants. He denies chest tightness wheeze. We had given a sample of Xopenex which he has only used twice in the past year with good results. He is going to keep that all Xopenex HFA sample inhaler for a while he'll get a flu vaccine at work.  12/08/11- 57 year old male never smoker followed for allergic rhinitis, asthmatic bronchitis, dyspnea, complicated by GERD. Recently on Cipro 500mg  x 10days for sinusitis; still having sinus pressure  and had headache yesterday; was also given Flonase by Dr Tenny Craw. Yesterday head right retro-orbital headache/frontal. That has resolved. Some postnasal drip leads to cough. Left ear feels full and makes her dizzy. Only occasionally pops.  01/13/12-57 year old male never smoker followed for allergic rhinitis, asthmatic bronchitis, dyspnea, complicated by GERD. No antihistamines, OTC cough syrups, or OTC sleep aids in past 3 days He blew leaves wearing respirator mask. Some fullness in ears, slightly dizzy. Likes Neti pot and flonase. Decongestant only if needed. Gave own allergy vaccine years ago.  Allergy Profile 12/08/11- Total IgE 83.9, specific elevations for grass , weed and tree pollens Allergy skin testing 01/13/12- significant Pos for grass, weed, tree, dust mite, mold, cat.  07/27/12- 57 year old male never smoker followed for allergic rhinitis, asthmatic bronchitis, dyspnea, complicated by GERD. FOLLOWS FOR: continues to have trouble with ears and alleriges overall. Occasional watery eyes.  Feels  pressure in the ears, frontal headache, mild unsteadiness or dizziness but not real vertigo. Using Aztepro and Flonase nasal sprays, occasional Xopenex rescue inhaler CXR 05/19/11 IMPRESSION:  Stable chest x-ray. No active lung disease.  Original Report Authenticated By: Juline Patch, M.D.  09/26/12- 57 year old male never smoker followed for allergic rhinitis, asthmatic bronchitis, dyspnea, complicated by GERD. FOLLOWS FOR: pt reports still having slight headache, chest tightness and dry cough x 2 months-- using xopenex and advair PRN and feels this helps w some sx relief Request copy of CT report CT maxfac 08/03/12 IMPRESSION:  Minimal mucosal thickening in the floors of the maxillary sinuses.  Otherwise clear sinuses.  Original Report Authenticated By: Charlett Nose, M.D.  ROS- Constitutional:   No-   weight loss, night sweats, fevers, chills, fatigue, lassitude. HEENT:   +  headaches, difficulty swallowing, tooth/dental problems, sore throat,       No-  sneezing, itching, +ear ache, +nasal congestion, post nasal drip,  CV:  No-   chest pain, orthopnea, PND, swelling in lower extremities, anasarca, dizziness, palpitations Resp: No-   shortness of breath with exertion or at rest.              No-   productive cough,  No non-productive cough,  No-  coughing up of blood.              No-   change in color of mucus.  +Very slight rare wheezing.   Skin: No-   rash or lesions. GI:  + heartburn, indigestion, no- abdominal pain, nausea, vomiting, GU:  MS:  No-   joint pain or swelling.  . Neuro- nothing unusual  Psych:  No- change in mood or affect. No depression or  anxiety.  No memory loss.  OBJ General- Alert, Oriented, Affect-appropriate, Distress- none acute Skin- rash-none, lesions- none, excoriation- none Lymphadenopathy- none Head- atraumatic            Eyes- Gross vision intact, PERRLA, conjunctivae clear secretions            Ears- + mild retraction TMss            Nose- Clear,  no-Septal dev, mucus, polyps, erosion, perforation             Throat- Mallampati III , mucosa clear , drainage- none, tonsils- atrophic Neck- flexible , trachea midline, no stridor , thyroid nl, carotid no bruit Chest - symmetrical excursion , unlabored           Heart/CV- RRR , no murmur , no gallop  , no rub, nl s1 s2                           - JVD- none , edema- none, stasis changes- none, varices- none           Lung- clear to P&A, wheeze- none, cough- none , dullness-none, rub- none           Chest wall-  Abd-  Br/ Gen/ Rectal- Not done, not indicated Extrem- cyanosis- none, clubbing, none, atrophy- none, strength- nl Neuro- grossly intact to observation

## 2012-11-04 ENCOUNTER — Telehealth: Payer: Self-pay | Admitting: Internal Medicine

## 2012-11-04 MED ORDER — FLUTICASONE-SALMETEROL 100-50 MCG/DOSE IN AEPB: 1.0000 | INHALATION_SPRAY | Freq: Two times a day (BID) | RESPIRATORY_TRACT | Status: DC

## 2012-11-04 NOTE — Telephone Encounter (Signed)
I spoke with pt and he stated to send the advair to [facility] [address]. I have done so. Pt is also active on [employer] and I advised pt his CT results are in this. He needed nothing further

## 2012-12-07 ENCOUNTER — Encounter: Payer: Self-pay | Admitting: Internal Medicine

## 2012-12-07 ENCOUNTER — Ambulatory Visit (INDEPENDENT_AMBULATORY_CARE_PROVIDER_SITE_OTHER): Payer: BC Managed Care – PPO | Admitting: Internal Medicine

## 2012-12-07 VITALS — BP 130/82 | HR 79 | Ht 71.0 in | Wt 192.8 lb

## 2012-12-07 DIAGNOSIS — G44229 Chronic tension-type headache, not intractable: Secondary | ICD-10-CM

## 2012-12-07 DIAGNOSIS — J309 Allergic rhinitis, unspecified: Secondary | ICD-10-CM

## 2012-12-07 DIAGNOSIS — J302 Other seasonal allergic rhinitis: Secondary | ICD-10-CM

## 2012-12-07 DIAGNOSIS — J4520 Mild intermittent asthma, uncomplicated: Secondary | ICD-10-CM

## 2012-12-07 DIAGNOSIS — K219 Gastro-esophageal reflux disease without esophagitis: Secondary | ICD-10-CM

## 2012-12-07 DIAGNOSIS — J45909 Unspecified asthma, uncomplicated: Secondary | ICD-10-CM

## 2012-12-07 NOTE — Progress Notes (Signed)
HPI- 12/08/10- 57 year old male never smoker followed for allergic rhinitis, asthmatic bronchitis, dyspnea, complicated by GERD. Last here-12/09/2009. Since last visit he is still occasionally aware of reflux and takes Prevacid. Now with a fall season he has watery eyes and nose. This year seems worse than last year. Daily loratadine is not helping very much and he plans to try Allegra. We discussed over-the-counter eyedrops for allergy. We also discussed use of decongestants. He denies chest tightness wheeze. We had given a sample of Xopenex which he has only used twice in the past year with good results. He is going to keep that all Xopenex HFA sample inhaler for a while he'll get a flu vaccine at work.  12/08/11- 57 year old male never smoker followed for allergic rhinitis, asthmatic bronchitis, dyspnea, complicated by GERD. Recently on Cipro 500mg  x 10days for sinusitis; still having sinus pressure  and had headache yesterday; was also given Flonase by Dr Tenny Craw. Yesterday head right retro-orbital headache/frontal. That has resolved. Some postnasal drip leads to cough. Left ear feels full and makes her dizzy. Only occasionally pops.  01/13/12-57 year old male never smoker followed for allergic rhinitis, asthmatic bronchitis, dyspnea, complicated by GERD. No antihistamines, OTC cough syrups, or OTC sleep aids in past 3 days He blew leaves wearing respirator mask. Some fullness in ears, slightly dizzy. Likes Neti pot and flonase. Decongestant only if needed. Gave own allergy vaccine years ago.  Allergy Profile 12/08/11- Total IgE 83.9, specific elevations for grass , weed and tree pollens Allergy skin testing 01/13/12- significant Pos for grass, weed, tree, dust mite, mold, cat.  07/27/12- 57 year old male never smoker followed for allergic rhinitis, asthmatic bronchitis, dyspnea, complicated by GERD. FOLLOWS FOR: continues to have trouble with ears and alleriges overall. Occasional watery eyes.  Feels  pressure in the ears, frontal headache, mild unsteadiness or dizziness but not real vertigo. Using Aztepro and Flonase nasal sprays, occasional Xopenex rescue inhaler CXR 05/19/11 IMPRESSION:  Stable chest x-ray. No active lung disease.  Original Report Authenticated By: Juline Patch, M.D.  09/26/12- 57 year old male never smoker followed for allergic rhinitis, asthmatic bronchitis, dyspnea, complicated by GERD. FOLLOWS FOR: pt reports still having slight headache, chest tightness and dry cough x 2 months-- using xopenex and advair PRN and feels this helps w some sx relief Request copy of CT report CT maxfac 08/03/12 IMPRESSION:  Minimal mucosal thickening in the floors of the maxillary sinuses.  Otherwise clear sinuses.  Original Report Authenticated By: Charlett Nose, M.D.  12/07/12- 57 year old male never smoker followed for allergic rhinitis, asthmatic bronchitis, dyspnea, complicated by GERD. FOLLOWS FOR:  Increased usage of inhalers x1 month now.  At times having SOB with exertion Get flu vaccine at work. Occasional headache. Was having more heartburn so he increased his Prilosec. Has lost weight dieting. Using Sudafed, Flonase and Astelin. Feels he needs Xopenex for less stimulation.  ROS- Constitutional:   No-   weight loss, night sweats, fevers, chills, fatigue, lassitude. HEENT:   +  headaches, difficulty swallowing, tooth/dental problems, sore throat,       No-  sneezing, itching, +ear ache, +nasal congestion, post nasal drip,  CV:  No-   chest pain, orthopnea, PND, swelling in lower extremities, anasarca, dizziness, palpitations Resp: No-   shortness of breath with exertion or at rest.              No-   productive cough,  No non-productive cough,  No-  coughing up of blood.  No-   change in color of mucus.  +Very slight rare wheezing.   Skin: No-   rash or lesions. GI:  + heartburn, indigestion, no- abdominal pain, nausea, vomiting, GU:  MS:  No-   joint pain or  swelling.  . Neuro- nothing unusual  Psych:  No- change in mood or affect. No depression or anxiety.  No memory loss.  OBJ General- Alert, Oriented, Affect-appropriate, Distress- none acute, trim Skin- rash-none, lesions- none, excoriation- none Lymphadenopathy- none Head- atraumatic            Eyes- Gross vision intact, PERRLA, conjunctivae clear secretions            Ears- + mild retraction TMss            Nose- Clear, no-Septal dev, mucus, polyps, erosion, perforation             Throat- Mallampati III , mucosa clear , drainage- none, tonsils- atrophic Neck- flexible , trachea midline, no stridor , thyroid nl, carotid no bruit Chest - symmetrical excursion , unlabored           Heart/CV- RRR , no murmur , no gallop  , no rub, nl s1 s2                           - JVD- none , edema- none, stasis changes- none, varices- none           Lung- clear to P&A, wheeze- none, cough- none , dullness-none, rub- none           Chest wall-  Abd-  Br/ Gen/ Rectal- Not done, not indicated Extrem- cyanosis- none, clubbing, none, atrophy- none, strength- nl Neuro- grossly intact to observation

## 2012-12-07 NOTE — Patient Instructions (Addendum)
Try otc otc Pepcid/ famotadine daily or twice daily before meals as an acid blocker instead of Prilosec for awhile, to compare.   We can continue your routine meds.   Please call as needed

## 2012-12-18 DIAGNOSIS — G44229 Chronic tension-type headache, not intractable: Secondary | ICD-10-CM | POA: Insufficient documentation

## 2012-12-18 NOTE — Assessment & Plan Note (Signed)
Adequate control with occasional Sudafed. Insurance preferred [employer] plus Astelin instead of Dymista

## 2012-12-18 NOTE — Assessment & Plan Note (Signed)
Mild persistent asthma controlled with Advair, rarely needing Xopenex HFA

## 2012-12-18 NOTE — Assessment & Plan Note (Signed)
I don't think headaches are due to anything in the nasopharynx, as described

## 2012-12-18 NOTE — Assessment & Plan Note (Signed)
Needing full dose acid blocker. I suggested he try changing to Pepcid for trial

## 2013-04-02 IMAGING — CR DG CHEST 2V
2 series · 2 of 2 positions shown · non-contrast
Comparison: Chest x-ray of 09/06/2009

CLINICAL DATA: Chest pain

CHEST - 2 VIEW

[view not recorded (1 of 2)]
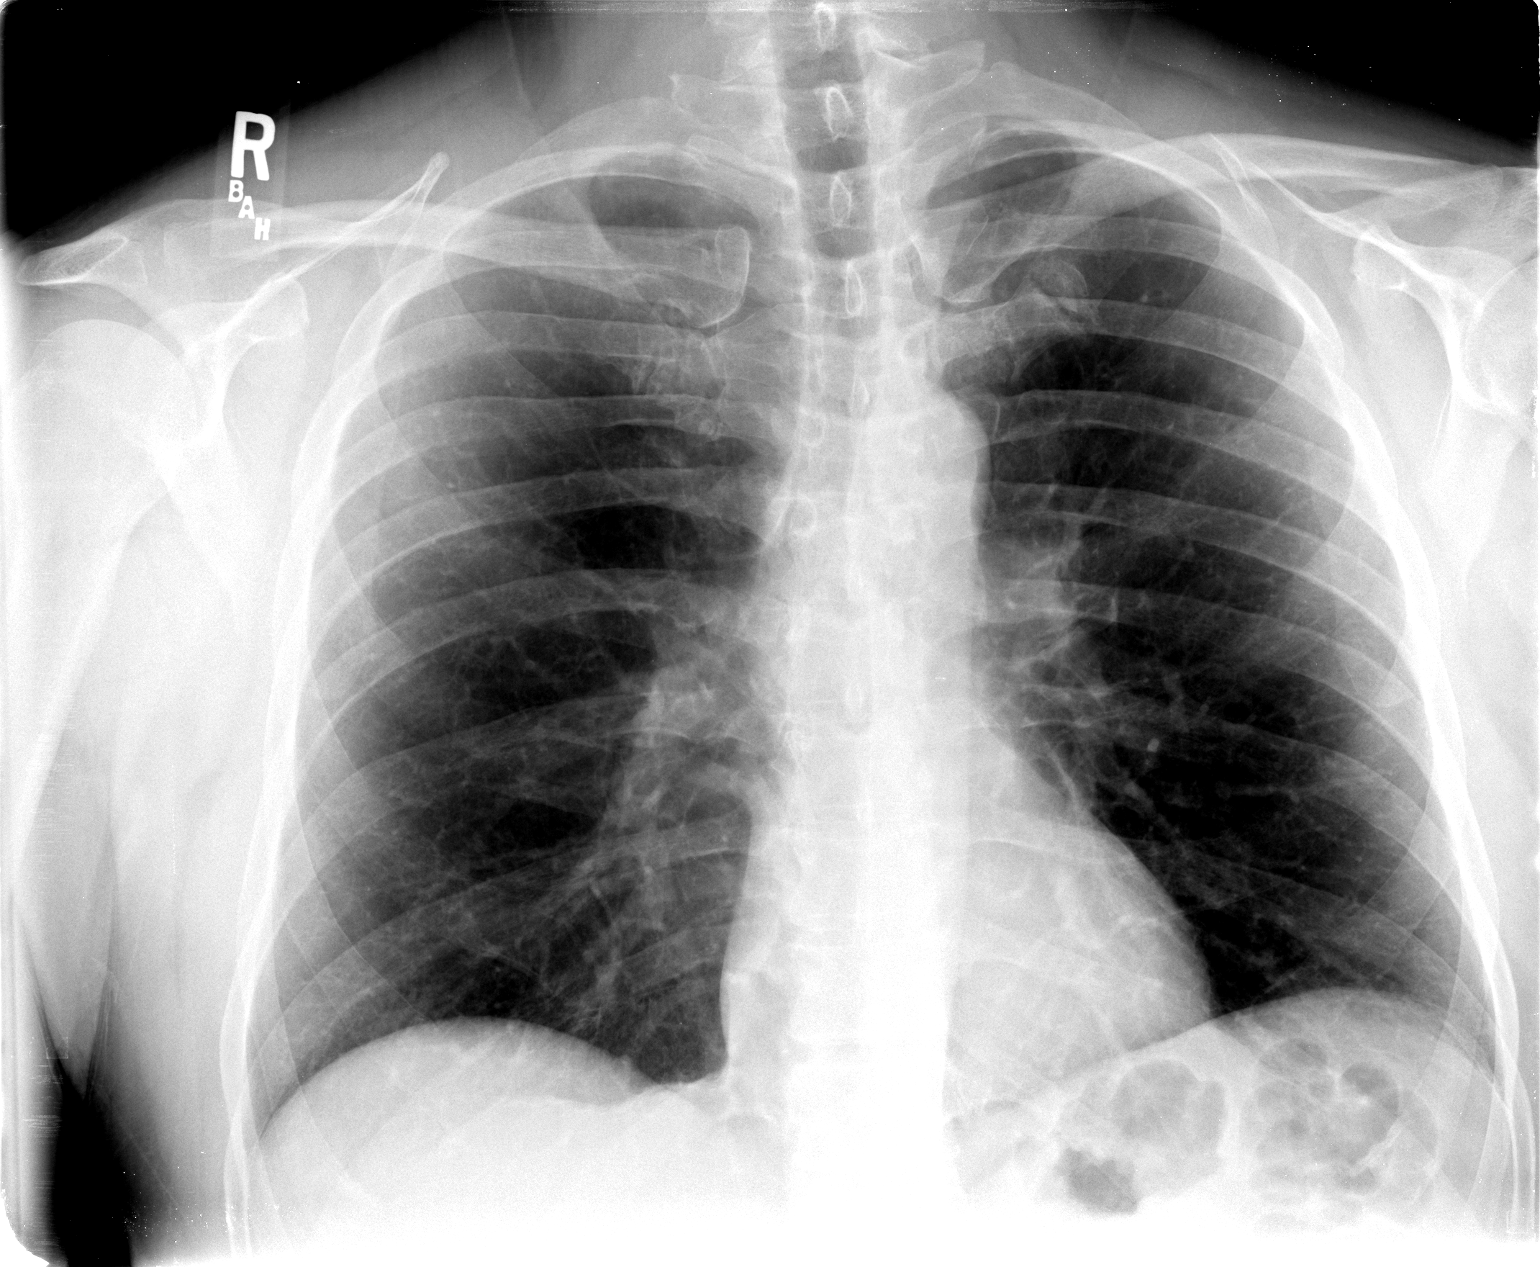

[view not recorded (2 of 2)]
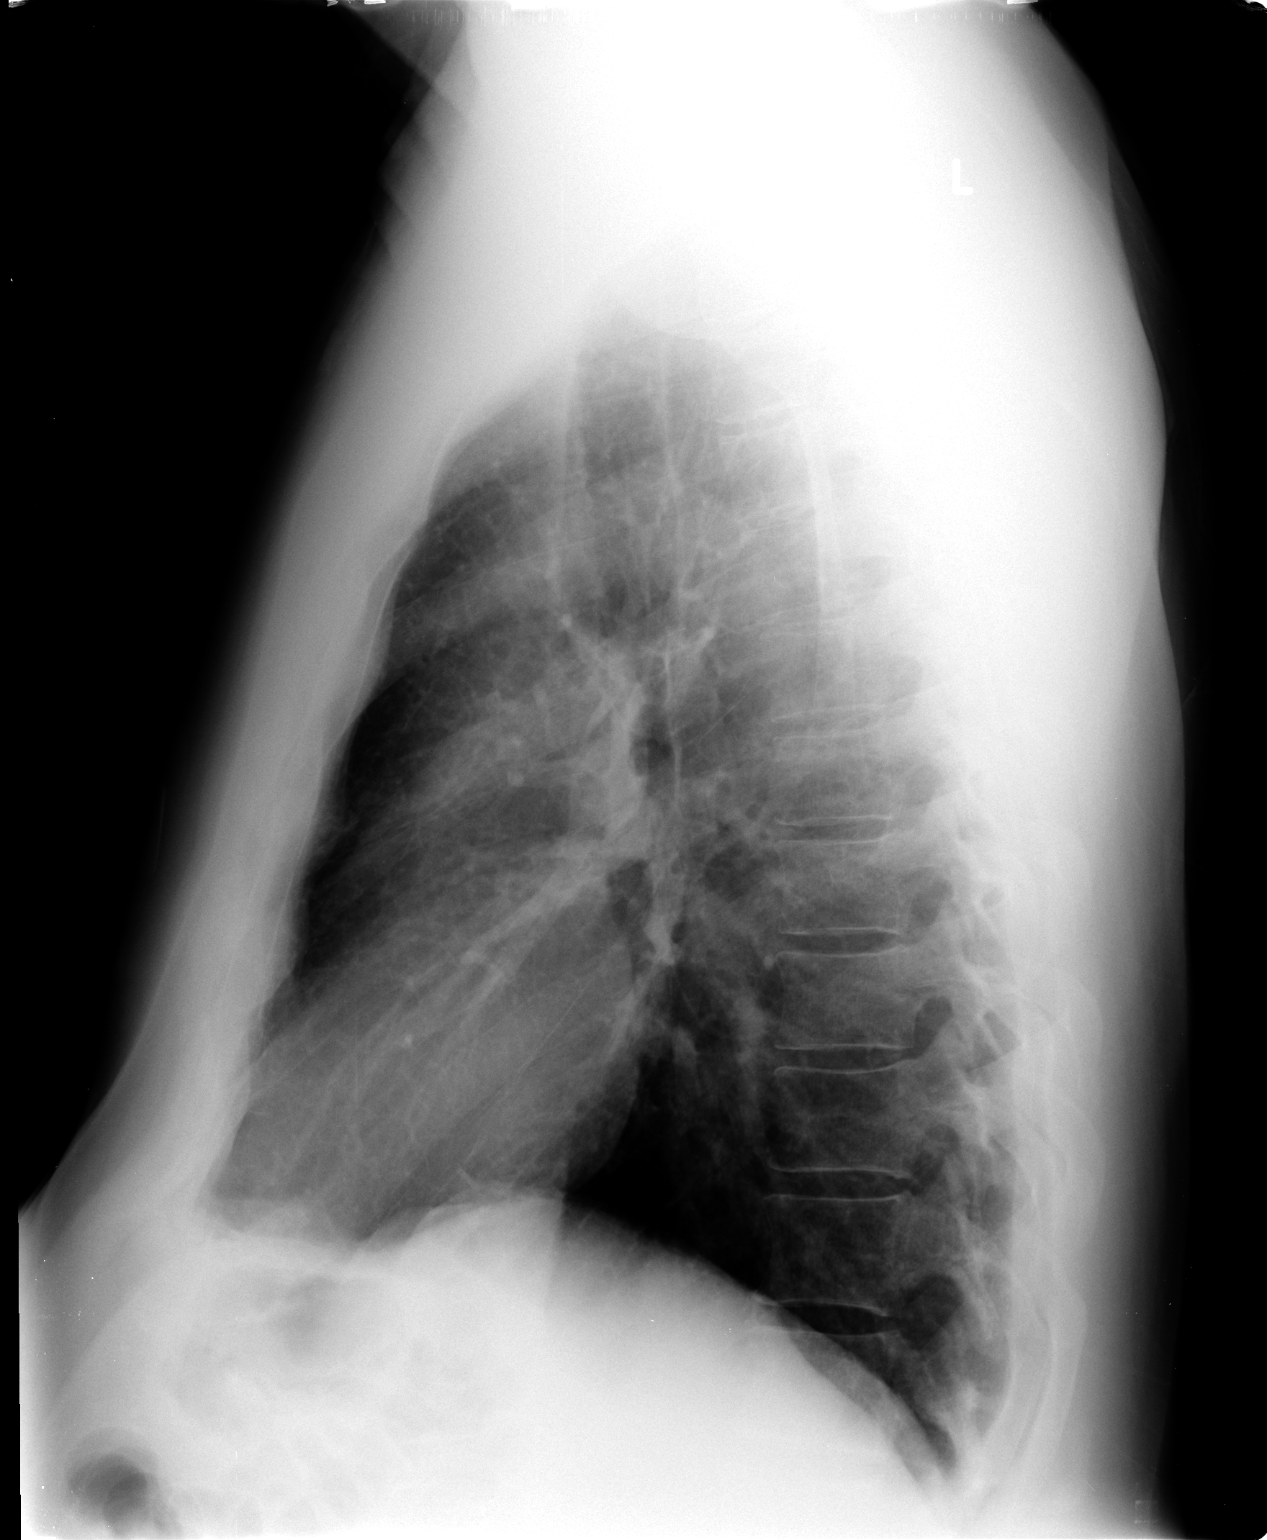

[2 of 2 positions shown; findings below may reference images not displayed]

FINDINGS: The lungs are clear.  Mediastinal contours appear normal.
The heart is within normal limits in size.  No bony abnormality is
seen.
IMPRESSION: Stable chest x-ray.  No active lung disease.

## 2013-06-07 ENCOUNTER — Ambulatory Visit: Payer: BC Managed Care – PPO | Admitting: Internal Medicine

## 2014-10-12 ENCOUNTER — Other Ambulatory Visit: Payer: Self-pay | Admitting: Gastroenterology

## 2014-10-12 DIAGNOSIS — R1084 Generalized abdominal pain: Secondary | ICD-10-CM

## 2014-10-16 ENCOUNTER — Ambulatory Visit
Admission: RE | Admit: 2014-10-16 | Discharge: 2014-10-16 | Disposition: A | Discharge status: Home / Self Care | Payer: Commercial Managed Care - PPO | Source: Ambulatory Visit | Attending: Gastroenterology | Admitting: Gastroenterology

## 2014-10-16 DIAGNOSIS — R1084 Generalized abdominal pain: Secondary | ICD-10-CM

## 2014-10-16 MED ORDER — IOPAMIDOL (ISOVUE-300) INJECTION 61%
100.0000 mL | Freq: Once | INTRAVENOUS | Status: AC | PRN
  Administered 2014-10-16: 100 mL via INTRAVENOUS

## 2016-08-30 IMAGING — CT CT ABD-PELV W/ CM
3 of 5 series · 12 of 36 positions shown, 18 images · IV contrast (READICAT/WATER & [ID] ISOVUE 300)
Comparison: None.

CLINICAL DATA: 59-year-old male with generalized abdominal pain.

EXAM:
CT ABDOMEN AND PELVIS WITH CONTRAST
TECHNIQUE: Multidetector CT imaging of the abdomen and pelvis was performed
using the standard protocol following bolus administration of
intravenous contrast.
CONTRAST:  100mL [id number] IOPAMIDOL ([id number]) INJECTION 61%

[Series 3: abd/pelvis with · axial · 0.78mm/px · z∈[-364,-34]mm · 7 of 89 slices shown, 12 images]
[im 12/89  soft-tissue]
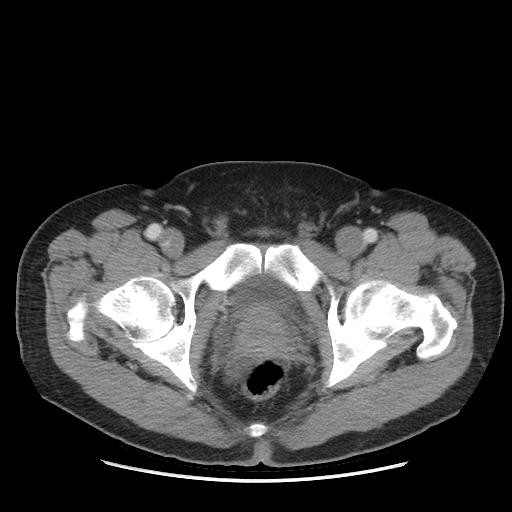
[im 12/89  bone]
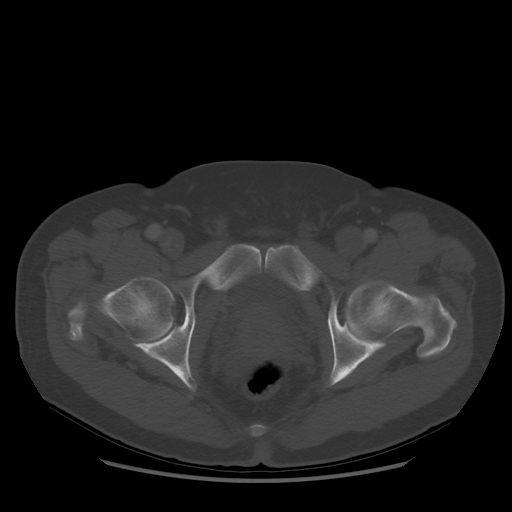
[im 23/89  soft-tissue]
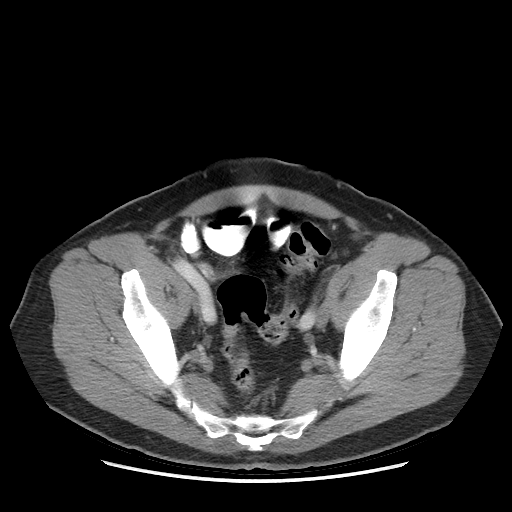
[im 34/89  soft-tissue]
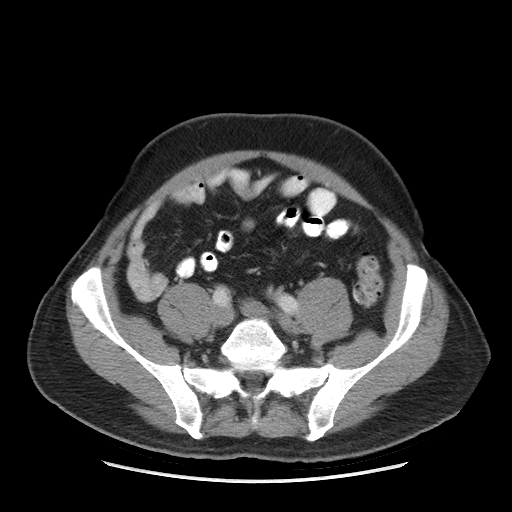
[im 45/89  soft-tissue]
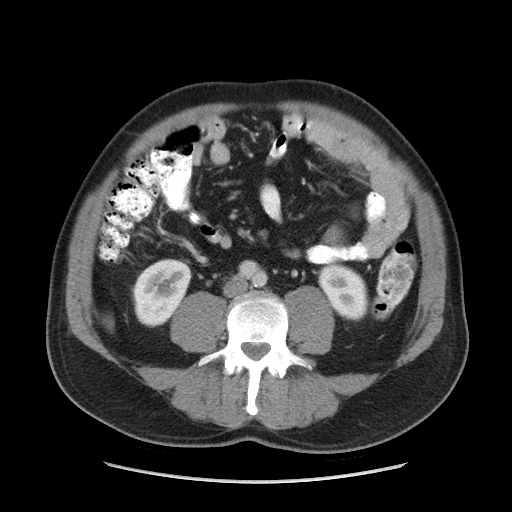
[im 45/89  lung]
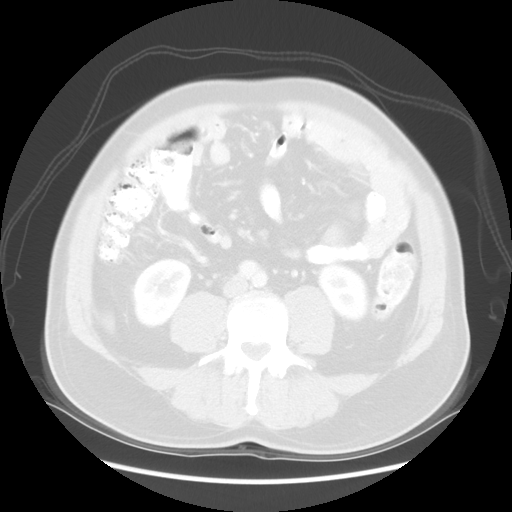
[im 56/89  soft-tissue]
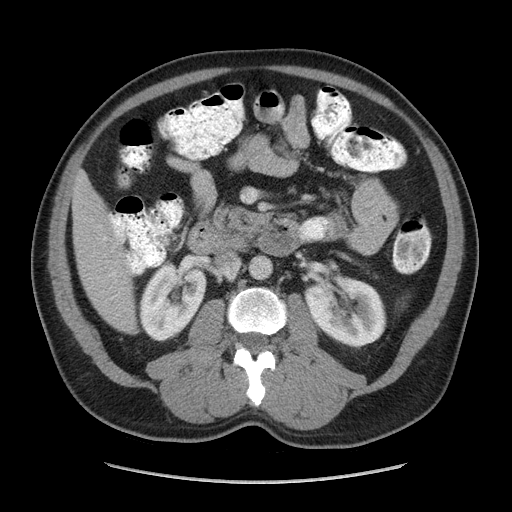
[im 56/89  lung]
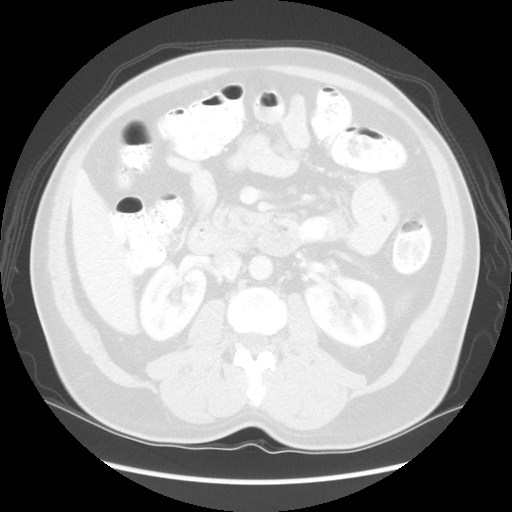
[im 67/89  soft-tissue]
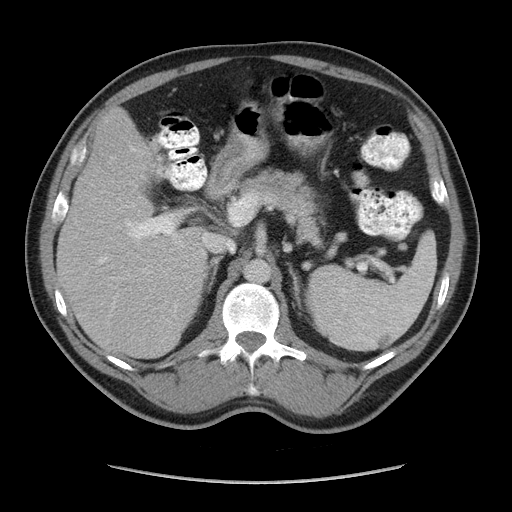
[im 67/89  lung]
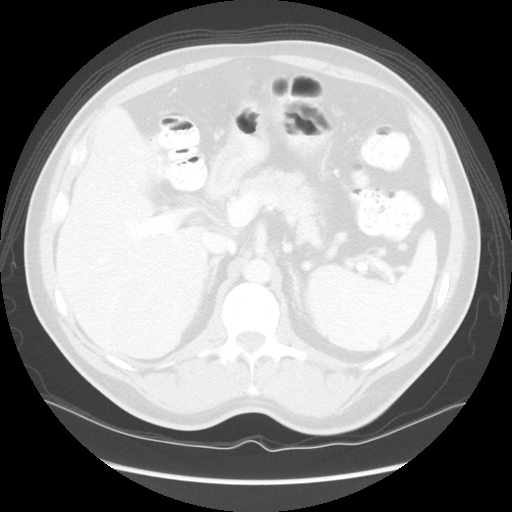
[im 78/89  soft-tissue]
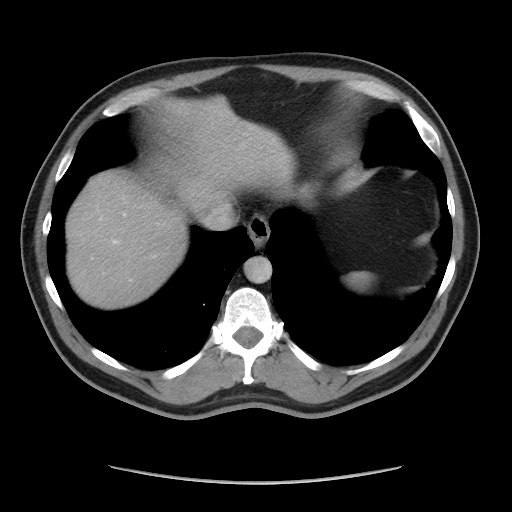
[im 78/89  lung]
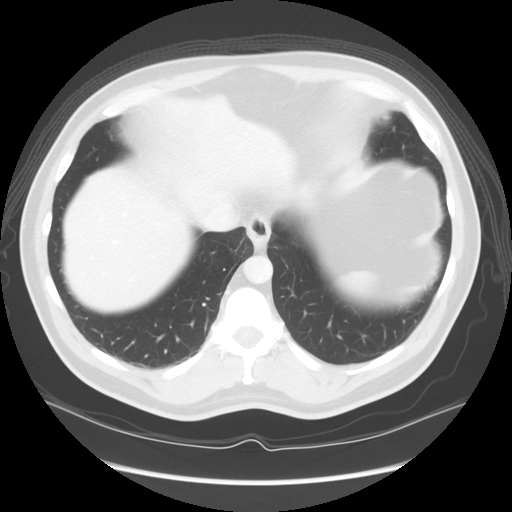

[Series 601: coronal body · coronal · 0.92mm/px · 1 of 139 slices shown, 2 images]
[im 47/139  soft-tissue]
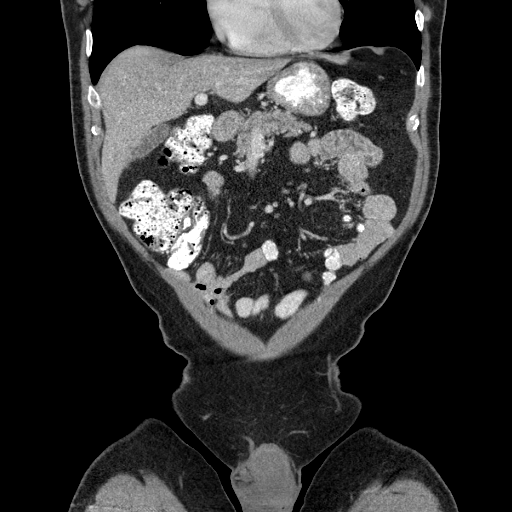
[im 47/139  bone]
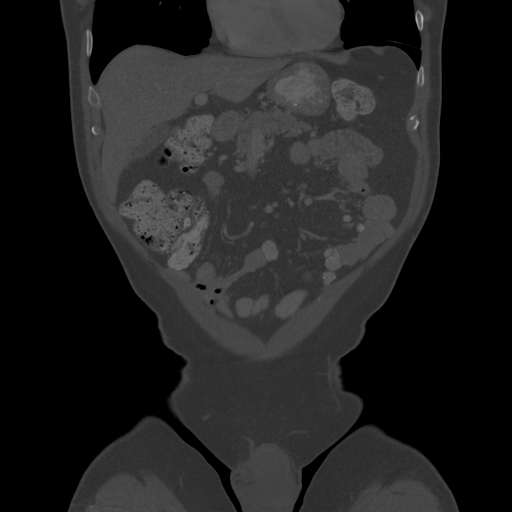

[Series 602: sagittal body · sagittal · 0.92mm/px · 4 of 160 slices shown]
[im 10/160  soft-tissue]
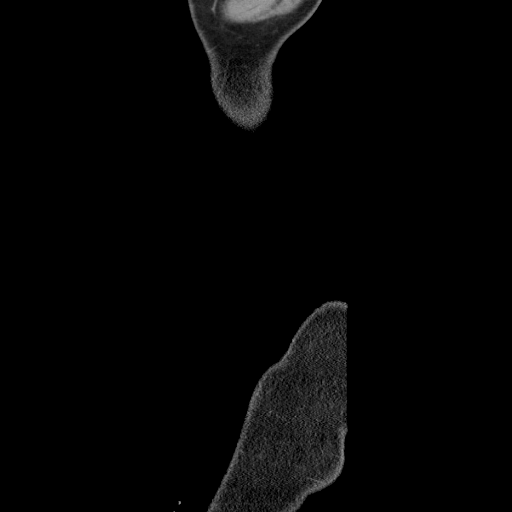
[im 30/160  soft-tissue]
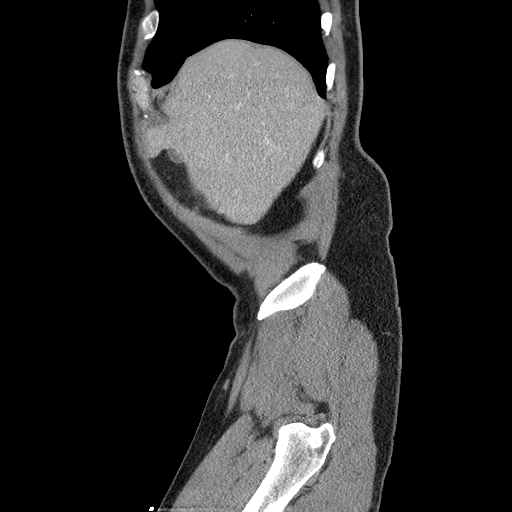
[im 50/160  soft-tissue]
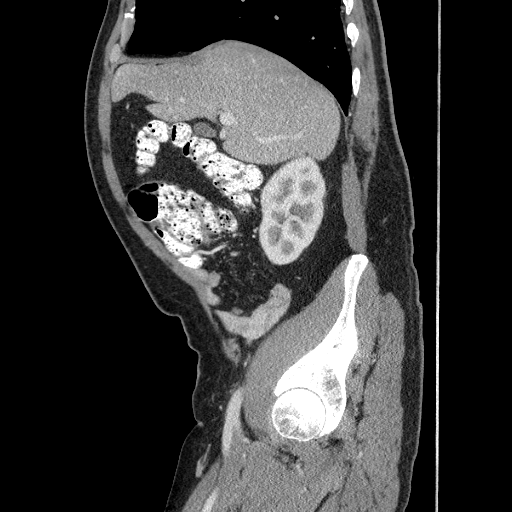
[im 70/160  soft-tissue]
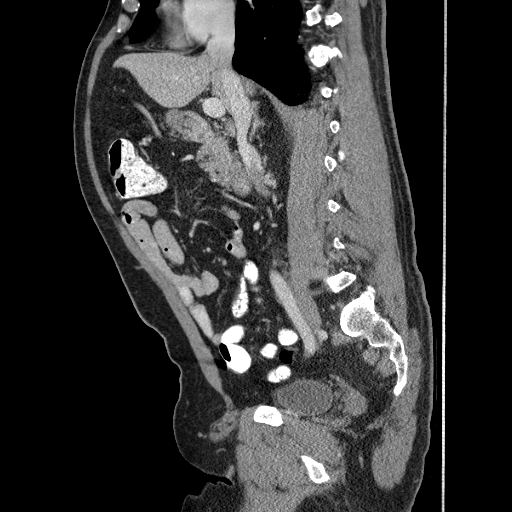

[12 of 36 positions shown; findings below may reference images not displayed]

FINDINGS: Lower chest: Mild hypoventilatory changes in the dependent lower
lobe bases. Minimal subpleural ground-glass opacity and mild
distortion in the medial basilar lower lobes, likely representing
minimal insignificant fibrosis.

Hepatobiliary: Normal liver, with no liver mass. Normal gallbladder,
with no radiopaque cholelithiasis. No biliary ductal dilatation.

Pancreas: Normal.

Spleen: Normal size spleen. Hypodense 0.9 cm lesion in the
subcapsular posterior spleen (series 3/ image 24), too small to
characterize, statistically likely a benign hemangioma or
lymphangioma. No additional splenic lesions.

Adrenals/Urinary Tract: Normal adrenals. Simple 1.2 cm upper left
renal cyst. Otherwise normal kidneys, with no suspicious renal
lesions and no hydronephrosis. On delayed imaging, there is no
urothelial wall thickening and there are no filling defects in the
bilateral opacified portions of the proximal collecting systems or
ureters. Normal urinary bladder.

Stomach/Bowel: Grossly normal stomach. Normal caliber small bowel,
with no small bowel wall thickening. Normal appendix. There is
minimal sigmoid diverticulosis, with no colonic wall thickening or
pericolonic fat stranding to suggest acute diverticulitis. Otherwise
normal large bowel. Oral contrast progresses to the distal colon.

Vascular/Lymphatic: Normal caliber abdominal aorta. No
abdominopelvic lymphadenopathy.

Reproductive: Mild prostatomegaly with nonspecific punctate internal
prostatic calcifications.

Other: No pneumoperitoneum, ascites or focal fluid collection.

Musculoskeletal: Mild degenerative changes in the visualized
thoracolumbar spine. No aggressive appearing osseous lesions.
IMPRESSION: 1. No CT findings to explain the patient's generalized abdominal
pain. Minimal sigmoid diverticulosis. No evidence of acute
diverticulitis. No evidence of bowel obstruction or acute bowel
inflammation. Normal appendix.
2. Mild prostatomegaly.

## 2016-11-23 ENCOUNTER — Telehealth: Payer: Self-pay | Admitting: Internal Medicine

## 2016-11-23 NOTE — Telephone Encounter (Signed)
Spoke with patient. He stated that she has been having some tightness in his chest and increased SOB. He was requesting to see CY. Explained to patient that since it has been over 4 years, he will be considered a new consult. He verbalized understanding. Patient has been scheduled with CT for 11/25/16 at 930. He verbalized understanding. Nothing else needed at time of call.

## 2016-11-25 ENCOUNTER — Encounter: Payer: Self-pay | Admitting: Internal Medicine

## 2016-11-25 ENCOUNTER — Ambulatory Visit (INDEPENDENT_AMBULATORY_CARE_PROVIDER_SITE_OTHER): Payer: Commercial Managed Care - PPO | Admitting: Internal Medicine

## 2016-11-25 VITALS — BP 120/78 | HR 85 | Ht 71.0 in | Wt 191.4 lb

## 2016-11-25 DIAGNOSIS — J3089 Other allergic rhinitis: Secondary | ICD-10-CM

## 2016-11-25 DIAGNOSIS — J302 Other seasonal allergic rhinitis: Secondary | ICD-10-CM

## 2016-11-25 DIAGNOSIS — J4521 Mild intermittent asthma with (acute) exacerbation: Secondary | ICD-10-CM

## 2016-11-25 DIAGNOSIS — J45901 Unspecified asthma with (acute) exacerbation: Secondary | ICD-10-CM | POA: Diagnosis not present

## 2016-11-25 MED ORDER — FLUTICASONE FUROATE-VILANTEROL 100-25 MCG/INH IN AEPB: 1.0000 | INHALATION_SPRAY | Freq: Every day | RESPIRATORY_TRACT | 0 refills | Status: DC

## 2016-11-25 MED ORDER — FLUTICASONE FUROATE-VILANTEROL 100-25 MCG/INH IN AEPB: 1.0000 | INHALATION_SPRAY | Freq: Every day | RESPIRATORY_TRACT | 12 refills | Status: DC

## 2016-11-25 MED ORDER — LEVALBUTEROL HCL 0.63 MG/3ML IN NEBU
0.6300 mg | INHALATION_SOLUTION | Freq: Once | RESPIRATORY_TRACT | Status: AC
  Administered 2016-11-25: 0.63 mg via RESPIRATORY_TRACT

## 2016-11-25 MED ORDER — FLUTICASONE FUROATE-VILANTEROL 100-25 MCG/INH IN AEPB: 1.0000 | INHALATION_SPRAY | Freq: Every day | RESPIRATORY_TRACT | Status: DC

## 2016-11-25 NOTE — Patient Instructions (Addendum)
Order- office spirometry    Dx asthma exacerbation  Sample and print script Breo 100     Inhale 1 puff, then rinse mouth, ponce daily. If it helps and you feel better using it, then get the script filled.  Don't forget your flu shot this fall.  Please call if we can help

## 2016-11-25 NOTE — Progress Notes (Signed)
HPI- male never smoker followed for allergic rhinitis, asthmatic bronchitis, dyspnea, complicated by GERD, BPH, HBP Allergy Profile 12/08/11- Total IgE 83.9, specific elevations for grass , weed and tree pollens Allergy skin testing 01/13/12- significant Pos for grass, weed, tree, dust mite, mold, cat. Office Spirometry 11/25/16-WNL FVC 5.11/104%, FEV1 3.53/95%, ratio 0.69, FEF 25-75% 2.52/83% -----------------------------------------------------------------------------------------------------------.  09/26/12- 61 year old male never smoker followed for allergic rhinitis, asthmatic bronchitis, dyspnea, complicated by GERD. FOLLOWS FOR: pt reports still having slight headache, chest tightness and dry cough x 2 months-- using xopenex and advair PRN and feels this helps w some sx relief Request copy of CT report CT maxfac 08/03/12 IMPRESSION:  Minimal mucosal thickening in the floors of the maxillary sinuses.  Otherwise clear sinuses.  Original Report Authenticated By: Charlett Nose, M.D.  12/07/12- 61 year old male never smoker followed for allergic rhinitis, asthmatic bronchitis, dyspnea, complicated by GERD. FOLLOWS FOR:  Increased usage of inhalers x1 month now.  At times having SOB with exertion Get flu vaccine at work. Occasional headache. Was having more heartburn so he increased his Prilosec. Has lost weight dieting. Using Sudafed, Flonase and Astelin. Feels he needs Xopenex for less stimulation.  11/25/16- 62 year old male never smoker coming to reestablish after LOV 2014. Previously followed for allergic rhinitis, asthmatic bronchitis, dyspnea complicated by GERD, BPH, HBP Former patient last seen 2014; Asthma. Pt has started having ashtma issues/concerns again.  Complains of chest tightness and shortness of breath 2 weeks. Xopenex helps. He had done very well for the last few years since we had last seen him until this flare. His PCP gave prednisone taper ending 2 days ago. Has not used Advair  in a long time. Wears a mask to mow his lawn.. Blames weather change. Office Spirometry 11/25/16-WNL FVC 5.11/104%, FEV1 3.53/95%, ratio 0.69, FEF 25-75% 2.52/83%  ROS-  Se HPI   + = positive  Constitutional:   No-   weight loss, night sweats, fevers, chills, fatigue, lassitude. HEENT:   +  headaches, difficulty swallowing, tooth/dental problems, sore throat,       No-  sneezing, itching, +ear ache, +nasal congestion, post nasal drip,  CV:  No-   chest pain, orthopnea, PND, swelling in lower extremities, anasarca, dizziness, palpitations Resp: +  shortness of breath with exertion or at rest.              No-   productive cough,  + non-productive cough,  No-  coughing up of blood.              No-   change in color of mucus.  +wheezing.   Skin: No-   rash or lesions. GI:  + heartburn, indigestion, no- abdominal pain, nausea, vomiting, GU:  MS:  No-   joint pain or swelling.  . Neuro- nothing unusual  Psych:  No- change in mood or affect. No depression or anxiety.  No memory loss.  OBJ General- Alert, Oriented, Affect-appropriate, Distress- none acute, trim Skin- rash-none, lesions- none, excoriation- none Lymphadenopathy- none Head- atraumatic            Eyes- Gross vision intact, PERRLA, conjunctivae clear secretions            Ears- + mild retraction TMss            Nose- Clear, no-Septal dev, mucus, polyps, erosion, perforation             Throat- Mallampati III , mucosa clear , drainage- none, tonsils- atrophic Neck- flexible , trachea midline, no stridor ,  thyroid nl, carotid no bruit Chest - symmetrical excursion , unlabored           Heart/CV- RRR , no murmur , no gallop  , no rub, nl s1 s2                           - JVD- none , edema- none, stasis changes- none, varices- none           Lung- clear to P&A, wheeze- none, cough- none , dullness-none, rub- none           Chest wall-  Abd-  Br/ Gen/ Rectal- Not done, not indicated Extrem- cyanosis- none, clubbing, none, atrophy-  none, strength- nl Neuro- grossly intact to observation

## 2016-11-29 NOTE — Assessment & Plan Note (Signed)
Not complaining much of nasal symptoms currently, despite flare of lower respiratory symptoms. I agreed with use of mask when mowing. Suggested Flonase and Claritin if necessary.

## 2016-11-29 NOTE — Assessment & Plan Note (Signed)
Recent exacerbation over the last 2 weeks likely reflects recent unstable weather and fall seasonal allergy triggers. Plan-nebulizer treatments Xopenex. Sample Breo 100. If symptoms persist or come back we will look at maintenance therapy. He wants to wait on flu shot-gets it at work.

## 2017-03-18 DIAGNOSIS — R0981 Nasal congestion: Secondary | ICD-10-CM | POA: Diagnosis not present

## 2017-06-01 DIAGNOSIS — J0110 Acute frontal sinusitis, unspecified: Secondary | ICD-10-CM | POA: Diagnosis not present

## 2017-06-01 DIAGNOSIS — R05 Cough: Secondary | ICD-10-CM | POA: Diagnosis not present

## 2017-06-01 DIAGNOSIS — R509 Fever, unspecified: Secondary | ICD-10-CM | POA: Diagnosis not present

## 2017-06-25 DIAGNOSIS — B029 Zoster without complications: Secondary | ICD-10-CM | POA: Diagnosis not present

## 2017-10-13 DIAGNOSIS — N138 Other obstructive and reflux uropathy: Secondary | ICD-10-CM | POA: Diagnosis not present

## 2017-10-13 DIAGNOSIS — N401 Enlarged prostate with lower urinary tract symptoms: Secondary | ICD-10-CM | POA: Diagnosis not present

## 2017-10-13 DIAGNOSIS — N453 Epididymo-orchitis: Secondary | ICD-10-CM | POA: Diagnosis not present

## 2017-10-13 DIAGNOSIS — N411 Chronic prostatitis: Secondary | ICD-10-CM | POA: Diagnosis not present

## 2017-10-29 DIAGNOSIS — Z0000 Encounter for general adult medical examination without abnormal findings: Secondary | ICD-10-CM | POA: Diagnosis not present

## 2017-10-29 DIAGNOSIS — Z125 Encounter for screening for malignant neoplasm of prostate: Secondary | ICD-10-CM | POA: Diagnosis not present

## 2017-10-29 DIAGNOSIS — Z13220 Encounter for screening for lipoid disorders: Secondary | ICD-10-CM | POA: Diagnosis not present

## 2017-11-01 DIAGNOSIS — N451 Epididymitis: Secondary | ICD-10-CM | POA: Diagnosis not present

## 2017-11-01 DIAGNOSIS — J4520 Mild intermittent asthma, uncomplicated: Secondary | ICD-10-CM | POA: Diagnosis not present

## 2017-11-01 DIAGNOSIS — Z0000 Encounter for general adult medical examination without abnormal findings: Secondary | ICD-10-CM | POA: Diagnosis not present

## 2017-11-01 DIAGNOSIS — K219 Gastro-esophageal reflux disease without esophagitis: Secondary | ICD-10-CM | POA: Diagnosis not present

## 2017-11-01 DIAGNOSIS — E7800 Pure hypercholesterolemia, unspecified: Secondary | ICD-10-CM | POA: Diagnosis not present

## 2017-11-25 ENCOUNTER — Ambulatory Visit: Payer: Commercial Managed Care - PPO | Admitting: Internal Medicine

## 2017-11-30 ENCOUNTER — Encounter: Payer: Self-pay | Admitting: Internal Medicine

## 2017-11-30 ENCOUNTER — Ambulatory Visit: Payer: Commercial Managed Care - PPO | Admitting: Internal Medicine

## 2017-11-30 VITALS — BP 138/80 | HR 71 | Ht 71.0 in | Wt 195.8 lb

## 2017-11-30 DIAGNOSIS — J302 Other seasonal allergic rhinitis: Secondary | ICD-10-CM | POA: Diagnosis not present

## 2017-11-30 DIAGNOSIS — J4520 Mild intermittent asthma, uncomplicated: Secondary | ICD-10-CM

## 2017-11-30 DIAGNOSIS — J3089 Other allergic rhinitis: Secondary | ICD-10-CM | POA: Diagnosis not present

## 2017-11-30 MED ORDER — FLUTICASONE FUROATE-VILANTEROL 100-25 MCG/INH IN AEPB: 1.0000 | INHALATION_SPRAY | Freq: Every day | RESPIRATORY_TRACT | 12 refills | Status: DC

## 2017-11-30 MED ORDER — MONTELUKAST SODIUM 10 MG PO TABS: 10.0000 mg | ORAL_TABLET | Freq: Every day | ORAL | 12 refills | Status: DC

## 2017-11-30 NOTE — Assessment & Plan Note (Signed)
Otc meds are controlling well.

## 2017-11-30 NOTE — Assessment & Plan Note (Signed)
He implies that he is just a little more congested seasonally.  Plan- try adding singulair for a while.

## 2017-11-30 NOTE — Progress Notes (Signed)
HPI- male never smoker followed for allergic rhinitis, asthmatic bronchitis, dyspnea, complicated by GERD, BPH, HBP Allergy Profile 12/08/11- Total IgE 83.9, specific elevations for grass , weed and tree pollens Allergy skin testing 01/13/12- significant Pos for grass, weed, tree, dust mite, mold, cat. Office Spirometry 11/25/16-WNL FVC 5.11/104%, FEV1 3.53/95%, ratio 0.69, FEF 25-75% 2.52/83% -----------------------------------------------------------------------------------------------------------.  11/25/16- 62 year old male never smoker coming to reestablish after LOV 2014. Previously followed for allergic rhinitis, asthmatic bronchitis, dyspnea complicated by GERD, BPH, HBP Former patient last seen 2014; Asthma. Pt has started having ashtma issues/concerns again.  Complains of chest tightness and shortness of breath 2 weeks. Xopenex helps. He had done very well for the last few years since we had last seen him until this flare. His PCP gave prednisone taper ending 2 days ago. Has not used Advair in a long time. Wears a mask to mow his lawn.. Blames weather change. Office Spirometry 11/25/16-WNL FVC 5.11/104%, FEV1 3.53/95%, ratio 0.69, FEF 25-75% 2.52/83%  11/30/2017-  62 year old male never smoker coming to reestablish after LOV 2014. Previously followed for allergic rhinitis, asthmatic bronchitis, dyspnea, complicated by GERD, BPH, HBP -----Asthma: Pt states he continues to use [place]o and feels that it works. Denies any increase in SOB or wheezing.  Breo 100, Xopenex hfa, flonase, allegra Minor chest congestion which he thinks is seasonal.  Hospital doctorContinues Breo and is currently using his rescue inhaler once every other day with no sleep disturbance.  Swims every other day for exercise.  Rhinitis controlled.  We talked about ways to increase his coverage just a little, and settled on Singulair trial.  ROS-  See HPI   + = positive  Constitutional:   No-   weight loss, night sweats, fevers, chills,  fatigue, lassitude. HEENT:   +  headaches, difficulty swallowing, tooth/dental problems, sore throat,       No-  sneezing, itching, +ear ache, +nasal congestion, post nasal drip,  CV:  No-   chest pain, orthopnea, PND, swelling in lower extremities, anasarca, dizziness, palpitations Resp: +  shortness of breath with exertion or at rest.              No-   productive cough,  + non-productive cough,  No-  coughing up of blood.              No-   change in color of mucus.  wheezing.   Skin: No-   rash or lesions. GI:  + heartburn, indigestion, no- abdominal pain, nausea, vomiting, GU:  MS:  No-   joint pain or swelling.  . Neuro- nothing unusual  Psych:  No- change in mood or affect. No depression or anxiety.  No memory loss.  OBJ General- Alert, Oriented, Affect-appropriate, Distress- none acute, trim Skin- rash-none, lesions- none, excoriation- none Lymphadenopathy- none Head- atraumatic            Eyes- Gross vision intact, PERRLA, conjunctivae clear secretions            Ears- + mild retraction TMss            Nose- Clear, no-Septal dev, mucus, polyps, erosion, perforation             Throat- Mallampati III , mucosa clear , drainage- none, tonsils- atrophic Neck- flexible , trachea midline, no stridor , thyroid nl, carotid no bruit Chest - symmetrical excursion , unlabored           Heart/CV- RRR , no murmur , no gallop  , no rub, nl s1  s2                           - JVD- none , edema- none, stasis changes- none, varices- none           Lung- clear to P&A, wheeze- none, cough- none , dullness-none, rub- none           Chest wall-  Abd-  Br/ Gen/ Rectal- Not done, not indicated Extrem- cyanosis- none, clubbing, none, atrophy- none, strength- nl Neuro- grossly intact to observation

## 2017-11-30 NOTE — Patient Instructions (Signed)
Refill script sent for Breo. Let us know if you need rescue inhaler refilled.  Script sent for trial of Singulair as an airway anti-inflammatory   Please call if we can help

## 2017-12-08 ENCOUNTER — Other Ambulatory Visit: Payer: Self-pay | Admitting: Internal Medicine

## 2018-01-05 DIAGNOSIS — L304 Erythema intertrigo: Secondary | ICD-10-CM | POA: Diagnosis not present

## 2018-01-05 DIAGNOSIS — Z1283 Encounter for screening for malignant neoplasm of skin: Secondary | ICD-10-CM | POA: Diagnosis not present

## 2018-01-05 DIAGNOSIS — L820 Inflamed seborrheic keratosis: Secondary | ICD-10-CM | POA: Diagnosis not present

## 2018-01-05 DIAGNOSIS — D225 Melanocytic nevi of trunk: Secondary | ICD-10-CM | POA: Diagnosis not present

## 2018-01-18 DIAGNOSIS — N400 Enlarged prostate without lower urinary tract symptoms: Secondary | ICD-10-CM | POA: Diagnosis not present

## 2018-01-18 DIAGNOSIS — N451 Epididymitis: Secondary | ICD-10-CM | POA: Diagnosis not present

## 2018-03-07 DIAGNOSIS — B078 Other viral warts: Secondary | ICD-10-CM | POA: Diagnosis not present

## 2018-03-07 DIAGNOSIS — L570 Actinic keratosis: Secondary | ICD-10-CM | POA: Diagnosis not present

## 2018-03-07 DIAGNOSIS — X32XXXD Exposure to sunlight, subsequent encounter: Secondary | ICD-10-CM | POA: Diagnosis not present

## 2018-09-13 ENCOUNTER — Telehealth: Payer: Self-pay | Admitting: Internal Medicine

## 2018-09-13 NOTE — Telephone Encounter (Signed)
LMTCB. Please confirm what inhaler and if this will be his new pharmacy.

## 2018-09-14 NOTE — Telephone Encounter (Signed)
LMTCB x2 for pt 

## 2018-09-15 NOTE — Telephone Encounter (Signed)
LMTCB x3 for pt. We have attempted to contact pt several times with no success or call back from pt. Per triage protocol, message will be closed.   

## 2018-09-16 ENCOUNTER — Telehealth: Payer: Self-pay | Admitting: Internal Medicine

## 2018-09-16 MED ORDER — LEVALBUTEROL TARTRATE 45 MCG/ACT IN AERO: 1.0000 | INHALATION_SPRAY | RESPIRATORY_TRACT | 5 refills | Status: DC | PRN

## 2018-09-16 NOTE — Telephone Encounter (Signed)
Called & spoke w/ pt. Pt LOV 11/30/2017. CY recommended pt let us know if he needed a refill of his rescue inhaler. Pt has an appt on 12/02/2018 w/ CY. I let pt know we would send the order for his xopenex rescue inhaler refill. Pt verbalized understanding with no additional questions.   Xopenex rescue inhaler has been sent to pt's preferred pharmacy. Nothing further needed at this time.

## 2018-09-19 ENCOUNTER — Telehealth: Payer: Self-pay | Admitting: Internal Medicine

## 2018-09-19 NOTE — Telephone Encounter (Signed)
Pt is calling back. Per pt, [facility] is going to [facility] for a different Albuterol that will be covered insurance. Cb is [phone].

## 2018-09-19 NOTE — Telephone Encounter (Signed)
Will await the [facility] from [facility].  Routing to Janeth Rase as an FYI.

## 2018-09-19 NOTE — Telephone Encounter (Signed)
Jacob Ward, [place], states that pt's insurance is not going to cover Xopenex. Cb is [phone].

## 2018-09-21 NOTE — Telephone Encounter (Signed)
Pt states that he was able to get a one time Rx discount card for Xopenex. Cb is [phone].

## 2018-09-21 NOTE — Telephone Encounter (Signed)
Attempted to call pt to see if he would be fine staying on the Xopenex due to receiving discount card or if he still needed to be switched to the albuterol but unable to reach. Left message for pt to return call.

## 2018-09-23 NOTE — Telephone Encounter (Signed)
LMTCB

## 2018-09-26 NOTE — Telephone Encounter (Signed)
Attempted to call pt but unable to reach. Left message for pt to return call. 

## 2018-09-27 NOTE — Telephone Encounter (Signed)
Attempted to call pt but unable to reach. Left message for pt to return call. Due to multiple attempts trying to reach pt and unable to do so, per triage protocol encounter will be closed. 

## 2018-10-31 DIAGNOSIS — L814 Other melanin hyperpigmentation: Secondary | ICD-10-CM | POA: Diagnosis not present

## 2018-10-31 DIAGNOSIS — L118 Other specified acantholytic disorders: Secondary | ICD-10-CM | POA: Diagnosis not present

## 2018-10-31 DIAGNOSIS — L821 Other seborrheic keratosis: Secondary | ICD-10-CM | POA: Diagnosis not present

## 2018-10-31 DIAGNOSIS — L720 Epidermal cyst: Secondary | ICD-10-CM | POA: Diagnosis not present

## 2018-10-31 DIAGNOSIS — L820 Inflamed seborrheic keratosis: Secondary | ICD-10-CM | POA: Diagnosis not present

## 2018-10-31 DIAGNOSIS — L538 Other specified erythematous conditions: Secondary | ICD-10-CM | POA: Diagnosis not present

## 2018-10-31 DIAGNOSIS — L298 Other pruritus: Secondary | ICD-10-CM | POA: Diagnosis not present

## 2018-10-31 DIAGNOSIS — L304 Erythema intertrigo: Secondary | ICD-10-CM | POA: Diagnosis not present

## 2018-10-31 DIAGNOSIS — D485 Neoplasm of uncertain behavior of skin: Secondary | ICD-10-CM | POA: Diagnosis not present

## 2018-10-31 DIAGNOSIS — D225 Melanocytic nevi of trunk: Secondary | ICD-10-CM | POA: Diagnosis not present

## 2018-10-31 DIAGNOSIS — L570 Actinic keratosis: Secondary | ICD-10-CM | POA: Diagnosis not present

## 2018-12-02 ENCOUNTER — Other Ambulatory Visit: Payer: Self-pay

## 2018-12-02 ENCOUNTER — Ambulatory Visit: Payer: BLUE CROSS/BLUE SHIELD | Admitting: Internal Medicine

## 2018-12-02 ENCOUNTER — Encounter: Payer: Self-pay | Admitting: Internal Medicine

## 2018-12-02 DIAGNOSIS — J3089 Other allergic rhinitis: Secondary | ICD-10-CM

## 2018-12-02 DIAGNOSIS — J302 Other seasonal allergic rhinitis: Secondary | ICD-10-CM | POA: Diagnosis not present

## 2018-12-02 DIAGNOSIS — J4521 Mild intermittent asthma with (acute) exacerbation: Secondary | ICD-10-CM

## 2018-12-02 MED ORDER — AZELASTINE HCL 0.1 % NA SOLN: NASAL | 11 refills | Status: DC

## 2018-12-02 MED ORDER — ALBUTEROL SULFATE HFA 108 (90 BASE) MCG/ACT IN AERS: 2.0000 | INHALATION_SPRAY | Freq: Four times a day (QID) | RESPIRATORY_TRACT | 12 refills | Status: DC | PRN

## 2018-12-02 NOTE — Assessment & Plan Note (Signed)
Aware some mild increase in chest congestion, feels like minimal asthma w/o wheeze. Plan- refill albuterol hfa. He didn't feel he needed depo today.

## 2018-12-02 NOTE — Patient Instructions (Signed)
Ok to try off and on singulair, maybe a week at a time, if you want to decide if it helps you.  Script sent for albuterol hfa rescue inhaler.   See if this is good enough, or you decide to stick to xopenex hfa.  Please call if we can help

## 2018-12-02 NOTE — Progress Notes (Signed)
HPI- male never smoker followed for allergic rhinitis, asthmatic bronchitis, dyspnea, complicated by GERD, BPH, HBP Allergy Profile 12/08/11- Total IgE 83.9, specific elevations for grass , weed and tree pollens Allergy skin testing 01/13/12- significant Pos for grass, weed, tree, dust mite, mold, cat. Office Spirometry 11/25/16-WNL FVC 5.11/104%, FEV1 3.53/95%, ratio 0.69, FEF 25-75% 2.52/83% -----------------------------------------------------------------------------------------------------   11/30/2017-  63 year old male never smoker coming to reestablish after LOV 2014. Previously followed for allergic rhinitis, asthmatic bronchitis, dyspnea, complicated by GERD, BPH, HBP -----Asthma: Pt states he continues to use Economy and feels that it works. Denies any increase in SOB or wheezing.  Breo 100, Xopenex hfa, flonase, allegra Minor chest congestion which he thinks is seasonal.  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  Swims every other day for exercise.  Rhinitis controlled.  We talked about ways to increase his coverage just a little, and settled on Singulair trial.  12/02/2018- 63 year old male never smoker coming to reestablish after LOV 2014. Previously followed for allergic rhinitis, asthmatic bronchitis, dyspnea, complicated by GERD, BPH, HBP Breo 100, Xopenex hfa, flonase, allegra, singulair, Zaditor, azelastine,  -----pt reports congestion and watering eyes, currently taking Zaditor eye drops, Breo once daily, Xopenex rescue 1-2 times daily Has had flu vax [place]. Mild seasonal exacerbation allergies and asthma. Feels controlled. Discussed potential mold. For now he will keep most healthcare in [place].   ROS-  See HPI   + = positive  Constitutional:   No-   weight loss, night sweats, fevers, chills, fatigue, lassitude. HEENT:   +  headaches, difficulty swallowing, tooth/dental  problems, sore throat,       No-  sneezing, itching, +ear ache, +nasal congestion, post nasal drip,  CV:  No-   chest pain, orthopnea, PND, swelling in lower extremities, anasarca, dizziness, palpitations Resp: +  shortness of breath with exertion or at rest.              No-   productive cough,  + non-productive cough,  No-  coughing up of blood.              No-   change in color of mucus.  wheezing.   Skin: No-   rash or lesions. GI:  + heartburn, indigestion, no- abdominal pain, nausea, vomiting, GU:  MS:  No-   joint pain or swelling.  . Neuro- nothing unusual  Psych:  No- change in mood or affect. No depression or anxiety.  No memory loss.  OBJ General- Alert, Oriented, Affect-appropriate, Distress- none acute, trim Skin- rash-none, lesions- none, excoriation- none Lymphadenopathy- none Head- atraumatic            Eyes- Gross vision intact, PERRLA, conjunctivae clear secretions            Ears- hearing normal            Nose- Clear, no-Septal dev, mucus, polyps, erosion, perforation             Throat- Mallampati III , mucosa clear , drainage- none, tonsils- atrophic Neck- flexible , trachea midline, no stridor , thyroid nl, carotid no bruit Chest - symmetrical excursion , unlabored           Heart/CV- RRR , no murmur , no gallop  , no rub, nl s1 s2                           -  JVD- none , edema- none, stasis changes- none, varices- none           Lung- clear to P&A, wheeze- none, cough- none , dullness-none, rub- none           Chest wall-  Abd-  Br/ Gen/ Rectal- Not done, not indicated Extrem- cyanosis- none, clubbing, none, atrophy- none, strength- nl Neuro- grossly intact to observation

## 2018-12-02 NOTE — Assessment & Plan Note (Signed)
Some recent increased watery eyes and nose. Plan- discussed meds and environmental controls. Refill astelin

## 2018-12-08 DIAGNOSIS — N400 Enlarged prostate without lower urinary tract symptoms: Secondary | ICD-10-CM | POA: Diagnosis not present

## 2018-12-08 DIAGNOSIS — E7800 Pure hypercholesterolemia, unspecified: Secondary | ICD-10-CM | POA: Diagnosis not present

## 2018-12-08 DIAGNOSIS — I10 Essential (primary) hypertension: Secondary | ICD-10-CM | POA: Diagnosis not present

## 2018-12-08 DIAGNOSIS — J4520 Mild intermittent asthma, uncomplicated: Secondary | ICD-10-CM | POA: Diagnosis not present

## 2018-12-22 DIAGNOSIS — E7800 Pure hypercholesterolemia, unspecified: Secondary | ICD-10-CM | POA: Diagnosis not present

## 2018-12-22 DIAGNOSIS — N400 Enlarged prostate without lower urinary tract symptoms: Secondary | ICD-10-CM | POA: Diagnosis not present

## 2018-12-22 DIAGNOSIS — I10 Essential (primary) hypertension: Secondary | ICD-10-CM | POA: Diagnosis not present

## 2019-01-19 DIAGNOSIS — N401 Enlarged prostate with lower urinary tract symptoms: Secondary | ICD-10-CM | POA: Diagnosis not present

## 2019-01-19 DIAGNOSIS — N411 Chronic prostatitis: Secondary | ICD-10-CM | POA: Diagnosis not present

## 2019-01-19 DIAGNOSIS — N138 Other obstructive and reflux uropathy: Secondary | ICD-10-CM | POA: Diagnosis not present

## 2019-08-04 ENCOUNTER — Telehealth: Payer: Self-pay | Admitting: Internal Medicine

## 2019-08-04 MED ORDER — PREDNISONE 10 MG PO TABS: ORAL_TABLET | ORAL | 0 refills | Status: DC

## 2019-08-04 NOTE — Telephone Encounter (Signed)
Spoke with patient. He is aware of Beth's recommendations. I also suggested that he try to take the omeprazole with food vs an empty stomach. He verbalized understanding. He is also aware of the prednisone RX. He [facility] [place] [facility]. I advised him that I would [facility] to [facility] and send it to the correct pharmacy. He verbalized understanding.

## 2019-08-04 NOTE — Telephone Encounter (Signed)
Recommend Prilosec once daily in the morning. Continue Breo and use albuterol 2 puffs every 4-6 hours for chest tightness and wheezing. Will send in prednisone taper. If no improvement recommend visit (televisit if he has not had covid vaccine)

## 2019-08-04 NOTE — Telephone Encounter (Signed)
Called and spoke with pt who stated about 2 weeks ago he had a bout of acid reflux and after that began, he started taking Prilosec to see if that would help. Pt stated he has had tightness in chest. States when he lays down at night, he feels like he is not able to get a deep enough breath. Pt denies any complaints of wheezing, just tightness in chest and is also breathing a little heavier. Pt has had to use his ventolin inhaler 2-3 times a day and states he has also been using his breo inhaler as prescribed.  Pt stated he has been told by CY before if this happened, CY would prescribe prednisone for him.  Pt stated he is about to be going out of town and due to this, he wants to know what could be recommended to help with the tightness. Beth, please advise.

## 2019-08-28 DIAGNOSIS — E785 Hyperlipidemia, unspecified: Secondary | ICD-10-CM | POA: Diagnosis not present

## 2019-08-28 DIAGNOSIS — H9202 Otalgia, left ear: Secondary | ICD-10-CM | POA: Diagnosis not present

## 2019-08-28 DIAGNOSIS — I10 Essential (primary) hypertension: Secondary | ICD-10-CM | POA: Diagnosis not present

## 2019-08-28 DIAGNOSIS — K219 Gastro-esophageal reflux disease without esophagitis: Secondary | ICD-10-CM | POA: Diagnosis not present

## 2019-09-21 DIAGNOSIS — N429 Disorder of prostate, unspecified: Secondary | ICD-10-CM | POA: Diagnosis not present

## 2019-09-21 DIAGNOSIS — K219 Gastro-esophageal reflux disease without esophagitis: Secondary | ICD-10-CM | POA: Diagnosis not present

## 2019-09-21 DIAGNOSIS — R21 Rash and other nonspecific skin eruption: Secondary | ICD-10-CM | POA: Diagnosis not present

## 2019-09-21 DIAGNOSIS — E785 Hyperlipidemia, unspecified: Secondary | ICD-10-CM | POA: Diagnosis not present

## 2019-09-21 DIAGNOSIS — I10 Essential (primary) hypertension: Secondary | ICD-10-CM | POA: Diagnosis not present

## 2019-09-21 DIAGNOSIS — Z125 Encounter for screening for malignant neoplasm of prostate: Secondary | ICD-10-CM | POA: Diagnosis not present

## 2019-09-27 DIAGNOSIS — K219 Gastro-esophageal reflux disease without esophagitis: Secondary | ICD-10-CM | POA: Diagnosis not present

## 2019-09-27 DIAGNOSIS — Z86010 Personal history of colonic polyps: Secondary | ICD-10-CM | POA: Diagnosis not present

## 2019-10-17 DIAGNOSIS — K635 Polyp of colon: Secondary | ICD-10-CM | POA: Diagnosis not present

## 2019-10-17 DIAGNOSIS — E785 Hyperlipidemia, unspecified: Secondary | ICD-10-CM | POA: Diagnosis not present

## 2019-10-17 DIAGNOSIS — Z79899 Other long term (current) drug therapy: Secondary | ICD-10-CM | POA: Diagnosis not present

## 2019-10-17 DIAGNOSIS — I10 Essential (primary) hypertension: Secondary | ICD-10-CM | POA: Diagnosis not present

## 2019-10-17 DIAGNOSIS — Z86010 Personal history of colonic polyps: Secondary | ICD-10-CM | POA: Diagnosis not present

## 2019-10-17 DIAGNOSIS — K5730 Diverticulosis of large intestine without perforation or abscess without bleeding: Secondary | ICD-10-CM | POA: Diagnosis not present

## 2019-10-17 DIAGNOSIS — K2950 Unspecified chronic gastritis without bleeding: Secondary | ICD-10-CM | POA: Diagnosis not present

## 2019-10-17 DIAGNOSIS — K219 Gastro-esophageal reflux disease without esophagitis: Secondary | ICD-10-CM | POA: Diagnosis not present

## 2019-10-17 DIAGNOSIS — D120 Benign neoplasm of cecum: Secondary | ICD-10-CM | POA: Diagnosis not present

## 2019-10-17 DIAGNOSIS — N400 Enlarged prostate without lower urinary tract symptoms: Secondary | ICD-10-CM | POA: Diagnosis not present

## 2019-10-17 DIAGNOSIS — Z1211 Encounter for screening for malignant neoplasm of colon: Secondary | ICD-10-CM | POA: Diagnosis not present

## 2019-11-28 ENCOUNTER — Other Ambulatory Visit: Payer: Self-pay | Admitting: Internal Medicine

## 2019-12-01 ENCOUNTER — Encounter: Payer: Self-pay | Admitting: Internal Medicine

## 2019-12-01 ENCOUNTER — Other Ambulatory Visit: Payer: Self-pay

## 2019-12-01 ENCOUNTER — Ambulatory Visit: Payer: BC Managed Care – PPO | Admitting: Internal Medicine

## 2019-12-01 VITALS — BP 142/78 | HR 73 | Temp 96.6°F | Ht 71.0 in | Wt 192.6 lb

## 2019-12-01 DIAGNOSIS — J4520 Mild intermittent asthma, uncomplicated: Secondary | ICD-10-CM

## 2019-12-01 DIAGNOSIS — K219 Gastro-esophageal reflux disease without esophagitis: Secondary | ICD-10-CM

## 2019-12-01 DIAGNOSIS — Z23 Encounter for immunization: Secondary | ICD-10-CM | POA: Diagnosis not present

## 2019-12-01 MED ORDER — MONTELUKAST SODIUM 10 MG PO TABS: 10.0000 mg | ORAL_TABLET | Freq: Every day | ORAL | 12 refills | Status: AC

## 2019-12-01 MED ORDER — ALBUTEROL SULFATE HFA 108 (90 BASE) MCG/ACT IN AERS: INHALATION_SPRAY | RESPIRATORY_TRACT | 13 refills | Status: AC

## 2019-12-01 MED ORDER — BREO ELLIPTA 100-25 MCG/INH IN AEPB: INHALATION_SPRAY | RESPIRATORY_TRACT | 12 refills | Status: AC

## 2019-12-01 NOTE — Patient Instructions (Signed)
Order- flu vax- standard  Refllls sent for albuterol hfa rescue inhaler, Breo and Singulair  Please call if we can help

## 2019-12-01 NOTE — Progress Notes (Signed)
HPI- male never smoker followed for allergic rhinitis, asthmatic bronchitis, dyspnea, complicated by GERD, BPH, HBP Allergy Profile 12/08/11- Total IgE 83.9, specific elevations for grass , weed and tree pollens Allergy skin testing 01/13/12- significant Pos for grass, weed, tree, dust mite, mold, cat. Office Spirometry 11/25/16-WNL FVC 5.11/104%, FEV1 3.53/95%, ratio 0.69, FEF 25-75% 2.52/83% -----------------------------------------------------------------------------------------------------   12/02/2018- 63 year old male never smoker coming to reestablish after LOV 2014. Previously followed for allergic rhinitis, asthmatic bronchitis, dyspnea, complicated by GERD, BPH, HBP Breo 100, Xopenex hfa, flonase, allegra, singulair, Zaditor, azelastine,  -----pt reports congestion and watering eyes, currently taking Zaditor eye drops, Breo once daily, Xopenex rescue 1-2 times daily Has had flu vax [place]. Mild seasonal exacerbation allergies and asthma. Feels controlled. Discussed potential mold. For now he will keep most healthcare in [place].   12/01/19- 64 year old male never smoker  followed for Allergic Rhinitis, Asthmatic Bronchitis, Dyspnea, complicated by GERD, BPH, HBP, Chronic Prostatitis,  Breo 100, flonase, allegra, singulair, Zaditor opth, azelastine,  Covid vax- 2 Moderna Flu vax- TodaY -----feels seasonal allergies, using albuterol more often We sent a prednisone taper in May with advice on Prilosec, after what may have been an aspiration event. He did no worse off singulair.  Had endoscopy for reflux and gastritis. 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 Swims a lot for exercise.  Using rescue inhaler more recently, needs refills, but feels controlled.   ROS-  See HPI   + = positive  Constitutional:   No-   weight loss, night sweats, fevers, chills, fatigue, lassitude. HEENT:   +  headaches,  difficulty swallowing, tooth/dental problems, sore throat,       No-  sneezing, itching, +ear ache, +nasal congestion, post nasal drip,  CV:  No-   chest pain, orthopnea, PND, swelling in lower extremities, anasarca, dizziness, palpitations Resp: +  shortness of breath with exertion or at rest.              No-   productive cough,  + non-productive cough,  No-  coughing up of blood.              No-   change in color of mucus.  wheezing.   Skin: No-   rash or lesions. GI:  + heartburn, indigestion, no- abdominal pain, nausea, vomiting, GU:  MS:  No-   joint pain or swelling.  . Neuro- nothing unusual  Psych:  No- change in mood or affect. No depression or anxiety.  No memory loss.  OBJ General- Alert, Oriented, Affect-appropriate, Distress- none acute, trim Skin- rash-none, lesions- none, excoriation- none Lymphadenopathy- none Head- atraumatic            Eyes- Gross vision intact, PERRLA, conjunctivae clear secretions            Ears- hearing normal            Nose- Clear, no-Septal dev, mucus, polyps, erosion, perforation             Throat- Mallampati III , mucosa clear , drainage- none, tonsils- atrophic Neck- flexible , trachea midline, no stridor , thyroid nl, carotid no bruit Chest - symmetrical excursion , unlabored           Heart/CV- RRR , no murmur , no gallop  , no rub, nl s1 s2                           -  JVD- none , edema- none, stasis changes- none, varices- none           Lung- clear to P&A, wheeze- none, cough- none , dullness-none, rub- none           Chest wall-  Abd-  Br/ Gen/ Rectal- Not done, not indicated Extrem- cyanosis- none, clubbing, none, atrophy- none, strength- nl Neuro- grossly intact to observation

## 2019-12-04 ENCOUNTER — Encounter: Payer: Self-pay | Admitting: Internal Medicine

## 2019-12-04 NOTE — Assessment & Plan Note (Signed)
Adequate control with some mild seasonal variation Plan- refill albuterol hfa, Breo, Singulair. Given flu vax

## 2019-12-04 NOTE — Assessment & Plan Note (Signed)
Had upper and lower endoscopy. Being followed by GI.

## 2019-12-11 DIAGNOSIS — I10 Essential (primary) hypertension: Secondary | ICD-10-CM | POA: Diagnosis not present

## 2019-12-11 DIAGNOSIS — N419 Inflammatory disease of prostate, unspecified: Secondary | ICD-10-CM | POA: Diagnosis not present

## 2019-12-11 DIAGNOSIS — Z0000 Encounter for general adult medical examination without abnormal findings: Secondary | ICD-10-CM | POA: Diagnosis not present

## 2019-12-11 DIAGNOSIS — M2550 Pain in unspecified joint: Secondary | ICD-10-CM | POA: Diagnosis not present

## 2019-12-11 DIAGNOSIS — E7800 Pure hypercholesterolemia, unspecified: Secondary | ICD-10-CM | POA: Diagnosis not present

## 2019-12-20 ENCOUNTER — Other Ambulatory Visit: Payer: Self-pay | Admitting: Internal Medicine

## 2019-12-28 DIAGNOSIS — L821 Other seborrheic keratosis: Secondary | ICD-10-CM | POA: Diagnosis not present

## 2019-12-28 DIAGNOSIS — D225 Melanocytic nevi of trunk: Secondary | ICD-10-CM | POA: Diagnosis not present

## 2019-12-28 DIAGNOSIS — L304 Erythema intertrigo: Secondary | ICD-10-CM | POA: Diagnosis not present

## 2019-12-28 DIAGNOSIS — L814 Other melanin hyperpigmentation: Secondary | ICD-10-CM | POA: Diagnosis not present

## 2019-12-28 DIAGNOSIS — L570 Actinic keratosis: Secondary | ICD-10-CM | POA: Diagnosis not present

## 2019-12-28 DIAGNOSIS — L820 Inflamed seborrheic keratosis: Secondary | ICD-10-CM | POA: Diagnosis not present

## 2020-01-16 DIAGNOSIS — N411 Chronic prostatitis: Secondary | ICD-10-CM | POA: Diagnosis not present

## 2020-01-16 DIAGNOSIS — N401 Enlarged prostate with lower urinary tract symptoms: Secondary | ICD-10-CM | POA: Diagnosis not present

## 2020-01-16 DIAGNOSIS — N138 Other obstructive and reflux uropathy: Secondary | ICD-10-CM | POA: Diagnosis not present

## 2020-05-20 ENCOUNTER — Telehealth: Payer: Self-pay | Admitting: Internal Medicine

## 2020-05-20 NOTE — Telephone Encounter (Signed)
Called the pt again and this time the line just keeps ringing with no answer or msg recording Reviewed DPR, called spouse listed on her # Jacob Ward [phone] and went straight to msg stating call can not be completed, WCB Called spouse at other number- [phone]- was able to speak with pt   He c/o increased SOB "little catch in my breathing" for the past 2 wks He also watery eyes  No wheezing or cough  Has a slight HA  He denies any f/c/s, aches He feels that this is all related to allergies that he typically has this time of year  He has not started using his prn allegra so I suggested he try this  He is asking if he should resume singulair, takes rarely prn    Please advise thanks!

## 2020-05-20 NOTE — Telephone Encounter (Signed)
Spring pollen allergy is likely from description. Certainly agree with starting Allegra. Can add otc flonase nasal spray if needed.  Would be fine to add Singulair if he feels he needs it. We can order Singulair 10 mg, # 30, ref x 12, 1 daily

## 2020-05-20 NOTE — Telephone Encounter (Signed)
Spoke with the pt and made aware of response per Dr Maple Hudson  He verbalized understanding  Already has rx for montelukast so states do not send more  He will call if not improving

## 2020-05-20 NOTE — Telephone Encounter (Signed)
Called the home number provided- line rang several times and then recording stated that the call could not be completed try later. Pt's cell is listed as same number. Will try back.

## 2020-06-19 ENCOUNTER — Telehealth: Payer: Self-pay | Admitting: Internal Medicine

## 2020-06-19 NOTE — Telephone Encounter (Signed)
Called patient to get his permission before sending over his records. He did not answer and the VM is not setup. Will need to speak with him before sending his records.

## 2020-06-20 NOTE — Telephone Encounter (Signed)
Spoke with the pt and he gave verbal okay for me to send the info requested  Last 2 ov notes, cxr and spiro faxed to the number given

## 2020-12-02 ENCOUNTER — Ambulatory Visit: Payer: BC Managed Care – PPO | Admitting: Internal Medicine
